# Patient Record
Sex: Male | Born: 1985 | Race: White | Hispanic: No | Marital: Married | Smoking: Former smoker
Health system: Southern US, Community
[De-identification: ages and names within clinical notes are randomized; demographics above are authoritative.]

## PROBLEM LIST (undated history)

## (undated) ENCOUNTER — Emergency Department (HOSPITAL_COMMUNITY): Admission: EM | Payer: BC Managed Care – PPO | Source: Home / Self Care

## (undated) DIAGNOSIS — J45909 Unspecified asthma, uncomplicated: Secondary | ICD-10-CM

## (undated) HISTORY — PX: FINGER AMPUTATION: SHX636

---

## 2015-06-24 ENCOUNTER — Encounter (HOSPITAL_BASED_OUTPATIENT_CLINIC_OR_DEPARTMENT_OTHER): Payer: Self-pay | Admitting: *Deleted

## 2015-06-24 ENCOUNTER — Emergency Department (HOSPITAL_BASED_OUTPATIENT_CLINIC_OR_DEPARTMENT_OTHER)

## 2015-06-24 ENCOUNTER — Emergency Department (HOSPITAL_BASED_OUTPATIENT_CLINIC_OR_DEPARTMENT_OTHER)
Admission: EM | Admit: 2015-06-24 | Discharge: 2015-06-24 | Disposition: A | Discharge status: Home / Self Care | Attending: Emergency Medicine | Admitting: Emergency Medicine

## 2015-06-24 DIAGNOSIS — Y999 Unspecified external cause status: Secondary | ICD-10-CM | POA: Diagnosis not present

## 2015-06-24 DIAGNOSIS — F17210 Nicotine dependence, cigarettes, uncomplicated: Secondary | ICD-10-CM | POA: Diagnosis not present

## 2015-06-24 DIAGNOSIS — W11XXXA Fall on and from ladder, initial encounter: Secondary | ICD-10-CM | POA: Insufficient documentation

## 2015-06-24 DIAGNOSIS — Y929 Unspecified place or not applicable: Secondary | ICD-10-CM | POA: Diagnosis not present

## 2015-06-24 DIAGNOSIS — Y939 Activity, unspecified: Secondary | ICD-10-CM | POA: Diagnosis not present

## 2015-06-24 DIAGNOSIS — S99912A Unspecified injury of left ankle, initial encounter: Secondary | ICD-10-CM | POA: Diagnosis present

## 2015-06-24 DIAGNOSIS — S93402A Sprain of unspecified ligament of left ankle, initial encounter: Secondary | ICD-10-CM

## 2015-06-24 NOTE — ED Notes (Addendum)
Ice pack and elevation initiated, pain is more at medial aspect of ankle area, very tender to touch, pt states he took ibuprofen prior to arrival to ED

## 2015-06-24 NOTE — ED Notes (Signed)
Patient transported to X-ray 

## 2015-06-24 NOTE — ED Provider Notes (Signed)
CSN: [id number]     Arrival date & time 06/24/15  1027 History   First MD Initiated Contact with Patient 06/24/15 1048     Chief Complaint  Patient presents with  . Ankle Pain   HPI   30 year old male presents with left ankle pain. Patient reports that he was on a ladder when he tripped fell landing on his left ankle. He reports the urgent of the ankle, pain to the medial aspect. Patient reports he is able walk after the incident, but is having difficulty with ambulation due to pain. Patient reports taking ibuprofen at home. He denies any loss of sensation, motor function. No history of the same, no gross abnormalities noted. Patient denies any pain to the proximal lower extremity or fibular head.  History reviewed. No pertinent past medical history. History reviewed. No pertinent past surgical history. No family history on file. Social History  Substance Use Topics  . Smoking status: Current Every Day Smoker -- 0.50 packs/day    Types: Cigarettes  . Smokeless tobacco: None  . Alcohol Use: Yes     Comment: social    Review of Systems  All other systems reviewed and are negative.   Allergies  Review of patient's allergies indicates no known allergies.  Home Medications   Prior to Admission medications   Not on File   BP 122/81 mmHg  Temp(Src) 97.3 F (36.3 C) (Oral)  Resp 18  Ht  (1.854 m)  Wt 88.451 kg  BMI 25.73 kg/m2  SpO2 100%   Physical Exam  Constitutional: He is oriented to person, place, and time. He appears well-developed and well-nourished.  HENT:  Head: Normocephalic and atraumatic.  Eyes: Conjunctivae are normal. Pupils are equal, round, and reactive to light. Right eye exhibits no discharge. Left eye exhibits no discharge. No scleral icterus.  Neck: Normal range of motion. No JVD present. No tracheal deviation present.  Pulmonary/Chest: Effort normal. No stridor.  Musculoskeletal:  No obvious swelling or deformities, tenderness to palpation of the  medial malleolus, pain with eversion of the ankle, no laxity noted, pedal pulses 2+, full passive range of motion. Active plantar flexion/ dorsiflexion with pain  Neurological: He is alert and oriented to person, place, and time. Coordination normal.  Psychiatric: He has a normal mood and affect. His behavior is normal. Judgment and thought content normal.  Nursing note and vitals reviewed.   ED Course  Procedures (including critical care time) Labs Review Labs Reviewed - No data to display  Imaging Review Dg Ankle Complete Left  06/24/2015  CLINICAL DATA:  Larey Seat from ladder, 6-8 feet. Unable to weight bear or dorsiflex. Ladder was at home. EXAM: LEFT ANKLE COMPLETE - 3+ VIEW COMPARISON:  None. FINDINGS: There is no evidence of fracture, dislocation, or joint effusion. There is no evidence of arthropathy or other focal bone abnormality. Soft tissues are unremarkable. IMPRESSION: Negative. Electronically Signed   By: Paulina Fusi M.D.   On: 06/24/2015 11:07   I have personally reviewed and evaluated these images and lab results as part of my medical decision-making.   EKG Interpretation None      MDM   Final diagnoses:  Ankle sprain, left, initial encounter    Labs:  Imaging:  Consults:  Therapeutics:  Discharge Meds:   Assessment/Plan:30 year old male presents today with likely ankle sprain. No signs of fracture, no obvious swelling or deformities. Patient will be given crutches, work note, instructed to use ibuprofen, ice. No significant laxity on exam, primary care  follow-up.        Eyvonne MechanicJeffrey Lisanne Ponce, PA-C 06/24/15 1120  Rolan BuccoMelanie Belfi, MD 06/24/15 1135

## 2015-06-24 NOTE — Discharge Instructions (Signed)
Cryotherapy °Cryotherapy means treatment with cold. Ice or gel packs can be used to reduce both pain and swelling. Ice is the most helpful within the first 24 to 48 hours after an injury or flare-up from overusing a muscle or joint. Sprains, strains, spasms, burning pain, shooting pain, and aches can all be eased with ice. Ice can also be used when recovering from surgery. Ice is effective, has very few side effects, and is safe for most people to use. °PRECAUTIONS  °Ice is not a safe treatment option for people with: °· Raynaud phenomenon. This is a condition affecting small blood vessels in the extremities. Exposure to cold may cause your problems to return. °· Cold hypersensitivity. There are many forms of cold hypersensitivity, including: °· Cold urticaria. Red, itchy hives appear on the skin when the tissues begin to warm after being iced. °· Cold erythema. This is a red, itchy rash caused by exposure to cold. °· Cold hemoglobinuria. Red blood cells break down when the tissues begin to warm after being iced. The hemoglobin that carry oxygen are passed into the urine because they cannot combine with blood proteins fast enough. °· Numbness or altered sensitivity in the area being iced. °If you have any of the following conditions, do not use ice until you have discussed cryotherapy with your caregiver: °· Heart conditions, such as arrhythmia, angina, or chronic heart disease. °· High blood pressure. °· Healing wounds or open skin in the area being iced. °· Current infections. °· Rheumatoid arthritis. °· Poor circulation. °· Diabetes. °Ice slows the blood flow in the region it is applied. This is beneficial when trying to stop inflamed tissues from spreading irritating chemicals to surrounding tissues. However, if you expose your skin to cold temperatures for too Hirschman or without the proper protection, you can damage your skin or nerves. Watch for signs of skin damage due to cold. °HOME CARE INSTRUCTIONS °Follow  these tips to use ice and cold packs safely. °· Place a dry or damp towel between the ice and skin. A damp towel will cool the skin more quickly, so you may need to shorten the time that the ice is used. °· For a more rapid response, add gentle compression to the ice. °· Ice for no more than 10 to 20 minutes at a time. The bonier the area you are icing, the less time it will take to get the benefits of ice. °· Check your skin after 5 minutes to make sure there are no signs of a poor response to cold or skin damage. °· Rest 20 minutes or more between uses. °· Once your skin is numb, you can end your treatment. You can test numbness by very lightly touching your skin. The touch should be so light that you do not see the skin dimple from the pressure of your fingertip. When using ice, most people will feel these normal sensations in this order: cold, burning, aching, and numbness. °· Do not use ice on someone who cannot communicate their responses to pain, such as small children or people with dementia. °HOW TO MAKE AN ICE PACK °Ice packs are the most common way to use ice therapy. Other methods include ice massage, ice baths, and cryosprays. Muscle creams that cause a cold, tingly feeling do not offer the same benefits that ice offers and should not be used as a substitute unless recommended by your caregiver. °To make an ice pack, do one of the following: °· Place crushed ice or a   bag of frozen vegetables in a sealable plastic bag. Squeeze out the excess air. Place this bag inside another plastic bag. Slide the bag into a pillowcase or place a damp towel between your skin and the bag.  Mix 3 parts water with 1 part rubbing alcohol. Freeze the mixture in a sealable plastic bag. When you remove the mixture from the freezer, it will be slushy. Squeeze out the excess air. Place this bag inside another plastic bag. Slide the bag into a pillowcase or place a damp towel between your skin and the bag. SEEK MEDICAL CARE  IF:  You develop white spots on your skin. This may give the skin a blotchy (mottled) appearance.  Your skin turns blue or pale.  Your skin becomes waxy or hard.  Your swelling gets worse. MAKE SURE YOU:   Understand these instructions.  Will watch your condition.  Will get help right away if you are not doing well or get worse.   This information is not intended to replace advice given to you by your health care provider. Make sure you discuss any questions you have with your health care provider.   Document Released: 09/24/2010 Document Revised: 02/18/2014 Document Reviewed: 09/24/2010 Elsevier Interactive Patient Education 2016 [employer]  Ankle Sprain An ankle sprain is an injury to the strong, fibrous tissues (ligaments) that hold the bones of your ankle joint together.  CAUSES An ankle sprain is usually caused by a fall or by twisting your ankle. Ankle sprains most commonly occur when you step on the outer edge of your foot, and your ankle turns inward. People who participate in sports are more prone to these types of injuries.  SYMPTOMS   Pain in your ankle. The pain may be present at rest or only when you are trying to stand or walk.  Swelling.  Bruising. Bruising may develop immediately or within 1 to 2 days after your injury.  Difficulty standing or walking, particularly when turning corners or changing directions. DIAGNOSIS  Your caregiver will ask you details about your injury and perform a physical exam of your ankle to determine if you have an ankle sprain. During the physical exam, your caregiver will press on and apply pressure to specific areas of your foot and ankle. Your caregiver will try to move your ankle in certain ways. An X-ray exam may be done to be sure a bone was not broken or a ligament did not separate from one of the bones in your ankle (avulsion fracture).  TREATMENT  Certain types of braces can help stabilize your ankle. Your caregiver can  make a recommendation for this. Your caregiver may recommend the use of medicine for pain. If your sprain is severe, your caregiver may refer you to a surgeon who helps to restore function to parts of your skeletal system (orthopedist) or a physical therapist. HOME CARE INSTRUCTIONS   Apply ice to your injury for 1-2 days or as directed by your caregiver. Applying ice helps to reduce inflammation and pain.  Put ice in a plastic bag.  Place a towel between your skin and the bag.  Leave the ice on for 15-20 minutes at a time, every 2 hours while you are awake.  Only take over-the-counter or prescription medicines for pain, discomfort, or fever as directed by your caregiver.  Elevate your injured ankle above the level of your heart as much as possible for 2-3 days.  If your caregiver recommends crutches, use them as instructed. Gradually put weight  on the affected ankle. Continue to use crutches or a cane until you can walk without feeling pain in your ankle.  If you have a plaster splint, wear the splint as directed by your caregiver. Do not rest it on anything harder than a pillow for the first 24 hours. Do not put weight on it. Do not get it wet. You may take it off to take a shower or bath.  You may have been given an elastic bandage to wear around your ankle to provide support. If the elastic bandage is too tight (you have numbness or tingling in your foot or your foot becomes cold and blue), adjust the bandage to make it comfortable.  If you have an air splint, you may blow more air into it or let air out to make it more comfortable. You may take your splint off at night and before taking a shower or bath. Wiggle your toes in the splint several times per day to decrease swelling. SEEK MEDICAL CARE IF:   You have rapidly increasing bruising or swelling.  Your toes feel extremely cold or you lose feeling in your foot.  Your pain is not relieved with medicine. SEEK IMMEDIATE MEDICAL CARE  IF:  Your toes are numb or blue.  You have severe pain that is increasing. MAKE SURE YOU:   Understand these instructions.  Will watch your condition.  Will get help right away if you are not doing well or get worse.   This information is not intended to replace advice given to you by your health care provider. Make sure you discuss any questions you have with your health care provider.   Document Released: 01/28/2005 Document Revised: 02/18/2014 Document Reviewed: 02/09/2011 Elsevier Interactive Patient Education 2016 [employer]  Acute Ankle Sprain With Phase I Rehab An acute ankle sprain is a partial or complete tear in one or more of the ligaments of the ankle due to traumatic injury. The severity of the injury depends on both the number of ligaments sprained and the grade of sprain. There are 3 grades of sprains.   A grade 1 sprain is a mild sprain. There is a slight pull without obvious tearing. There is no loss of strength, and the muscle and ligament are the correct length.  A grade 2 sprain is a moderate sprain. There is tearing of fibers within the substance of the ligament where it connects two bones or two cartilages. The length of the ligament is increased, and there is usually decreased strength.  A grade 3 sprain is a complete rupture of the ligament and is uncommon. In addition to the grade of sprain, there are three types of ankle sprains.  Lateral ankle sprains: This is a sprain of one or more of the three ligaments on the outer side (lateral) of the ankle. These are the most common sprains. Medial ankle sprains: There is one large triangular ligament of the inner side (medial) of the ankle that is susceptible to injury. Medial ankle sprains are less common. Syndesmosis, "high ankle," sprains: The syndesmosis is the ligament that connects the two bones of the lower leg. Syndesmosis sprains usually only occur with very severe ankle sprains. SYMPTOMS  Pain,  tenderness, and swelling in the ankle, starting at the side of injury that may progress to the whole ankle and foot with time.  "Pop" or tearing sensation at the time of injury.  Bruising that may spread to the heel.  Impaired ability to walk soon after injury.  CAUSES   Acute ankle sprains are caused by trauma placed on the ankle that temporarily forces or pries the anklebone (talus) out of its normal socket.  Stretching or tearing of the ligaments that normally hold the joint in place (usually due to a twisting injury). RISK INCREASES WITH:  Previous ankle sprain.  Sports in which the foot may land awkwardly (i.e., basketball, volleyball, or soccer) or walking or running on uneven or rough surfaces.  Shoes with inadequate support to prevent sideways motion when stress occurs.  Poor strength and flexibility.  Poor balance skills.  Contact sports. PREVENTION   Warm up and stretch properly before activity.  Maintain physical fitness:  Ankle and leg flexibility, muscle strength, and endurance.  Cardiovascular fitness.  Balance training activities.  Use proper technique and have a coach correct improper technique.  Taping, protective strapping, bracing, or high-top tennis shoes may help prevent injury. Initially, tape is best; however, it loses most of its support function within 10 to 15 minutes.  Wear proper-fitted protective shoes (High-top shoes with taping or bracing is more effective than either alone).  Provide the ankle with support during sports and practice activities for 12 months following injury. PROGNOSIS   If treated properly, ankle sprains can be expected to recover completely; however, the length of recovery depends on the degree of injury.  A grade 1 sprain usually heals enough in 5 to 7 days to allow modified activity and requires an average of 6 weeks to heal completely.  A grade 2 sprain requires 6 to 10 weeks to heal completely.  A grade 3 sprain  requires 12 to 16 weeks to heal.  A syndesmosis sprain often takes more than 3 months to heal. RELATED COMPLICATIONS   Frequent recurrence of symptoms may result in a chronic problem. Appropriately addressing the problem the first time decreases the frequency of recurrence and optimizes healing time. Severity of the initial sprain does not predict the likelihood of later instability.  Injury to other structures (bone, cartilage, or tendon).  A chronically unstable or arthritic ankle joint is a possibility with repeated sprains. TREATMENT Treatment initially involves the use of ice, medication, and compression bandages to help reduce pain and inflammation. Ankle sprains are usually immobilized in a walking cast or boot to allow for healing. Crutches may be recommended to reduce pressure on the injury. After immobilization, strengthening and stretching exercises may be necessary to regain strength and a full range of motion. Surgery is rarely needed to treat ankle sprains. MEDICATION   Nonsteroidal anti-inflammatory medications, such as aspirin and ibuprofen (do not take for the first 3 days after injury or within 7 days before surgery), or other minor pain relievers, such as acetaminophen, are often recommended. Take these as directed by your caregiver. Contact your caregiver immediately if any bleeding, stomach upset, or signs of an allergic reaction occur from these medications.  Ointments applied to the skin may be helpful.  Pain relievers may be prescribed as necessary by your caregiver. Do not take prescription pain medication for longer than 4 to 7 days. Use only as directed and only as much as you need. HEAT AND COLD  Cold treatment (icing) is used to relieve pain and reduce inflammation for acute and chronic cases. Cold should be applied for 10 to 15 minutes every 2 to 3 hours for inflammation and pain and immediately after any activity that aggravates your symptoms. Use ice packs or an  ice massage.  Heat treatment may be used before  performing stretching and strengthening activities prescribed by your caregiver. Use a heat pack or a warm soak. SEEK IMMEDIATE MEDICAL CARE IF:   Pain, swelling, or bruising worsens despite treatment.  You experience pain, numbness, discoloration, or coldness in the foot or toes.  New, unexplained symptoms develop (drugs used in treatment may produce side effects.) EXERCISES  PHASE I EXERCISES RANGE OF MOTION (ROM) AND STRETCHING EXERCISES - Ankle Sprain, Acute Phase I, Weeks 1 to 2 These exercises may help you when beginning to restore flexibility in your ankle. You will likely work on these exercises for the 1 to 2 weeks after your injury. Once your physician, physical therapist, or athletic trainer sees adequate progress, he or she will advance your exercises. While completing these exercises, remember:   Restoring tissue flexibility helps normal motion to return to the joints. This allows healthier, less painful movement and activity.  An effective stretch should be held for at least 30 seconds.  A stretch should never be painful. You should only feel a gentle lengthening or release in the stretched tissue. RANGE OF MOTION - Dorsi/Plantar Flexion  While sitting with your right / left knee straight, draw the top of your foot upwards by flexing your ankle. Then reverse the motion, pointing your toes downward.  Hold each position for __________ seconds.  After completing your first set of exercises, repeat this exercise with your knee bent. Repeat __________ times. Complete this exercise __________ times per day.  RANGE OF MOTION - Ankle Alphabet  Imagine your right / left big toe is a pen.  Keeping your hip and knee still, write out the entire alphabet with your "pen." Make the letters as large as you can without increasing any discomfort. Repeat __________ times. Complete this exercise __________ times per day.  STRENGTHENING  EXERCISES - Ankle Sprain, Acute -Phase I, Weeks 1 to 2 These exercises may help you when beginning to restore strength in your ankle. You will likely work on these exercises for 1 to 2 weeks after your injury. Once your physician, physical therapist, or athletic trainer sees adequate progress, he or she will advance your exercises. While completing these exercises, remember:   Muscles can gain both the endurance and the strength needed for everyday activities through controlled exercises.  Complete these exercises as instructed by your physician, physical therapist, or athletic trainer. Progress the resistance and repetitions only as guided.  You may experience muscle soreness or fatigue, but the pain or discomfort you are trying to eliminate should never worsen during these exercises. If this pain does worsen, stop and make certain you are following the directions exactly. If the pain is still present after adjustments, discontinue the exercise until you can discuss the trouble with your clinician. STRENGTH - Dorsiflexors  Secure a rubber exercise band/tubing to a fixed object (i.e., table, pole) and loop the other end around your right / left foot.  Sit on the floor facing the fixed object. The band/tubing should be slightly tense when your foot is relaxed.  Slowly draw your foot back toward you using your ankle and toes.  Hold this position for __________ seconds. Slowly release the tension in the band and return your foot to the starting position. Repeat __________ times. Complete this exercise __________ times per day.  STRENGTH - Plantar-flexors   Sit with your right / left leg extended. Holding onto both ends of a rubber exercise band/tubing, loop it around the ball of your foot. Keep a slight tension in the band.  Slowly push your toes away from you, pointing them downward.  Hold this position for __________ seconds. Return slowly, controlling the tension in the band/tubing. Repeat  __________ times. Complete this exercise __________ times per day.  STRENGTH - Ankle Eversion  Secure one end of a rubber exercise band/tubing to a fixed object (table, pole). Loop the other end around your foot just before your toes.  Place your fists between your knees. This will focus your strengthening at your ankle.  Drawing the band/tubing across your opposite foot, slowly, pull your little toe out and up. Make sure the band/tubing is positioned to resist the entire motion.  Hold this position for __________ seconds. Have your muscles resist the band/tubing as it slowly pulls your foot back to the starting position.  Repeat __________ times. Complete this exercise __________ times per day.  STRENGTH - Ankle Inversion  Secure one end of a rubber exercise band/tubing to a fixed object (table, pole). Loop the other end around your foot just before your toes.  Place your fists between your knees. This will focus your strengthening at your ankle.  Slowly, pull your big toe up and in, making sure the band/tubing is positioned to resist the entire motion.  Hold this position for __________ seconds.  Have your muscles resist the band/tubing as it slowly pulls your foot back to the starting position. Repeat __________ times. Complete this exercises __________ times per day.  STRENGTH - Towel Curls  Sit in a chair positioned on a non-carpeted surface.  Place your right / left foot on a towel, keeping your heel on the floor.  Pull the towel toward your heel by only curling your toes. Keep your heel on the floor.  If instructed by your physician, physical therapist, or athletic trainer, add weight to the end of the towel. Repeat __________ times. Complete this exercise __________ times per day.   This information is not intended to replace advice given to you by your health care provider. Make sure you discuss any questions you have with your health care provider.   Document Released:  08/29/2004 Document Revised: 02/18/2014 Document Reviewed: 05/12/2008 Elsevier Interactive Patient Education [employer]

## 2015-06-24 NOTE — ED Notes (Signed)
DC instructions provided to pt, pt teaching done re: resting, ice application, comfort measures and elevation to use for pain relief, also discussed and pt teaching done with crutch use. Also informed patient when need may arise to seek further care or return to ED.

## 2015-06-24 NOTE — ED Notes (Signed)
Fell approx 6-8 feet off later, fell backwards, rotated ankle, pain at medial aspect of ankle, very tender to touch

## 2019-09-12 DIAGNOSIS — M940 Chondrocostal junction syndrome [Tietze]: Secondary | ICD-10-CM | POA: Diagnosis not present

## 2019-11-03 DIAGNOSIS — Z20828 Contact with and (suspected) exposure to other viral communicable diseases: Secondary | ICD-10-CM | POA: Diagnosis not present

## 2019-11-14 DIAGNOSIS — Z20828 Contact with and (suspected) exposure to other viral communicable diseases: Secondary | ICD-10-CM | POA: Diagnosis not present

## 2019-11-14 DIAGNOSIS — R051 Acute cough: Secondary | ICD-10-CM | POA: Diagnosis not present

## 2019-11-17 DIAGNOSIS — B342 Coronavirus infection, unspecified: Secondary | ICD-10-CM | POA: Diagnosis not present

## 2019-11-23 DIAGNOSIS — J0100 Acute maxillary sinusitis, unspecified: Secondary | ICD-10-CM | POA: Diagnosis not present

## 2019-11-23 DIAGNOSIS — R438 Other disturbances of smell and taste: Secondary | ICD-10-CM | POA: Diagnosis not present

## 2019-11-23 DIAGNOSIS — Z20828 Contact with and (suspected) exposure to other viral communicable diseases: Secondary | ICD-10-CM | POA: Diagnosis not present

## 2019-12-02 DIAGNOSIS — B342 Coronavirus infection, unspecified: Secondary | ICD-10-CM | POA: Diagnosis not present

## 2020-01-31 DIAGNOSIS — R945 Abnormal results of liver function studies: Secondary | ICD-10-CM | POA: Diagnosis not present

## 2020-01-31 DIAGNOSIS — Z0000 Encounter for general adult medical examination without abnormal findings: Secondary | ICD-10-CM | POA: Diagnosis not present

## 2020-01-31 DIAGNOSIS — Z136 Encounter for screening for cardiovascular disorders: Secondary | ICD-10-CM | POA: Diagnosis not present

## 2020-01-31 DIAGNOSIS — R7301 Impaired fasting glucose: Secondary | ICD-10-CM | POA: Diagnosis not present

## 2020-01-31 DIAGNOSIS — R799 Abnormal finding of blood chemistry, unspecified: Secondary | ICD-10-CM | POA: Diagnosis not present

## 2020-03-16 ENCOUNTER — Other Ambulatory Visit: Payer: Self-pay

## 2020-03-16 ENCOUNTER — Ambulatory Visit
Admission: EM | Admit: 2020-03-16 | Discharge: 2020-03-16 | Disposition: A | Discharge status: Home / Self Care | Payer: BC Managed Care – PPO

## 2020-03-16 DIAGNOSIS — M5442 Lumbago with sciatica, left side: Secondary | ICD-10-CM

## 2020-03-16 MED ORDER — KETOROLAC TROMETHAMINE 30 MG/ML IJ SOLN
30.0000 mg | Freq: Once | INTRAMUSCULAR | Status: AC
  Administered 2020-03-16: 30 mg via INTRAMUSCULAR

## 2020-03-16 MED ORDER — CYCLOBENZAPRINE HCL 10 MG PO TABS: 10.0000 mg | ORAL_TABLET | Freq: Two times a day (BID) | ORAL | 0 refills | Status: AC | PRN

## 2020-03-16 MED ORDER — KETOROLAC TROMETHAMINE 60 MG/2ML IM SOLN: 60.0000 mg | Freq: Once | INTRAMUSCULAR | Status: DC

## 2020-03-16 MED ORDER — PREDNISONE 20 MG PO TABS: ORAL_TABLET | ORAL | 0 refills | Status: DC

## 2020-03-16 MED ORDER — DEXAMETHASONE SODIUM PHOSPHATE 10 MG/ML IJ SOLN
10.0000 mg | Freq: Once | INTRAMUSCULAR | Status: AC
  Administered 2020-03-16: 10 mg via INTRAMUSCULAR

## 2020-03-16 NOTE — ED Triage Notes (Signed)
Patient presents to Urgent Care with complaints of back pain since 01/26.  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 He reports pain with sitting a 1/10 and increases to 6/10 with ambulation. Pt applied heating pad with some relief and Tylenol.

## 2020-03-16 NOTE — ED Provider Notes (Signed)
[facility]    CSN: [id number] Arrival date & time: 03/16/20  1233      History   Chief Complaint Chief Complaint  Patient presents with  . Back Pain    HPI Caleb Thompson is a 35 y.o. male.   HPI Patient presents today with back pain which has been ongoing since 03/08/2020.  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  He is unaware of any injury.  He has been taking Tylenol and applying heat to his back without any relief of symptoms.  He endorses resting over the weekend which seemed to improve pain however he returned to work today and after a 25-minute drive he was unable to bear weight without experiencing severe shooting pain which increased on the left side.  He is unaware of any injury.  No history of recurrent back pain  History reviewed. No pertinent past medical history.  There are no problems to display for this patient.   History reviewed. No pertinent surgical history.     Home Medications    Prior to Admission medications   Medication Sig Start Date End Date Taking? Authorizing Provider  Multiple Vitamin (THERA) TABS Take 1 tablet by mouth daily.    [provider]    Family History Family History  Problem Relation Age of Onset  . Healthy Mother   . Healthy Father     Social History Social History   Tobacco Use  . Smoking status: Current Every Day Smoker    Packs/day: 0.50    Types: Cigarettes  . Smokeless tobacco: Never Used  Substance Use Topics  . Alcohol use: Yes    Comment: social  . Drug use: No     Allergies   Patient has no known allergies.  Review of Systems Review of Systems Pertinent negatives listed in HPI   Physical Exam Triage Vital Signs ED Triage Vitals  Enc Vitals Group     BP 03/16/20 1405 (!) 142/101     Pulse Rate 03/16/20 1405 74     Resp 03/16/20 1405 20     Temp 03/16/20 1405 97.9 F (36.6 C)     Temp Source 03/16/20 1405  Oral     SpO2 03/16/20 1405 97 %     Weight 03/16/20 1413 190 lb (86.2 kg)     Height --      Head Circumference --      Peak Flow --      Pain Score 03/16/20 1413 1     Pain Loc --      Pain Edu? --      Excl. in GC? --    No data found.  Updated Vital Signs BP (!) 142/101 (BP Location: Left Arm)   Pulse 74   Temp 97.9 F (36.6 C) (Oral)   Resp 20   Wt 190 lb (86.2 kg)   SpO2 97%   BMI 25.07 kg/m   Visual Acuity Right Eye Distance:   Left Eye Distance:   Bilateral Distance:    Right Eye Near:   Left Eye Near:    Bilateral Near:     Physical Exam  General appearance: alert, well developed, well nourished, cooperative and in no distress Head: Normocephalic, without obvious abnormality, atraumatic Respiratory: Respirations even and unlabored, normal respiratory rate Heart: rate and rhythm normal. No gallop or murmurs noted on exam  Thoracic/lumbar spine exam: Patient appears to be in mild to moderate  pain, antalgic gait noted. Lumbosacral spine area reveals no local tenderness or mass. Painful and reduced LS ROM noted. Straight leg raise is positive at 30 degrees on left. Motor strength and sensation normal Skin: Skin color, texture, turgor normal. No rashes seen  Psych: Appropriate mood and affect. Neurologic: Mental status: Alert, oriented to person, place, and time, thought content appropriate.  UC Treatments / Results  Labs (all labs ordered are listed, but only abnormal results are displayed) Labs Reviewed - No data to display  EKG   Radiology No results found.  Procedures Procedures (including critical care time)  Medications Ordered in UC Medications - No data to display  Initial Impression / Assessment and Plan / UC Course  I have reviewed the triage vital signs and the nursing notes.  Pertinent labs & imaging results that were available during my care of the patient were reviewed by me and considered in my medical decision making (see chart for  details).     Patient presents today with acute low back pain with left-sided sciatica without known injury. Toradol 60 mg and Decadron 10 mg IM given in clinic today. Oral prednisone taper start tomorrow.  Cyclobenzaprine for back pain as needed as prescribed.  PCP follow-up if symptoms worsen or do not improve. Final Clinical Impressions(s) / UC Diagnoses   Final diagnoses:  Acute midline low back pain with left-sided sciatica     Discharge Instructions     Start oral prednisone taper tomorrow to 05/31/2020 as you received a steroid injection while here in clinic today.    You also received Toradol for pain this will last for up to 8 hours therefore do not take any ibuprofen or naproxen within the next 8 hours.  For pain I have prescribed you cyclobenzaprine 10 mg you can take this up to 3 times daily for back pain.  Avoid driving or operating any machinery while taking medication as this can cause severe drowsiness.    ED Prescriptions    Medication Sig Dispense Auth. Provider   predniSONE (DELTASONE) 20 MG tablet Take 3 PO QAM x3days, 2 PO QAM x3days, 1 PO QAM x3days 18 tablet Bing Neighbors, FNP   cyclobenzaprine (FLEXERIL) 10 MG tablet Take 1 tablet (10 mg total) by mouth 2 (two) times daily as needed for muscle spasms. 20 tablet Bing Neighbors, FNP     PDMP not reviewed this encounter.   Bing Neighbors, FNP 03/16/20 [phone]

## 2020-03-16 NOTE — Discharge Instructions (Signed)
Start oral prednisone taper tomorrow to 05/31/2020 as you received a steroid injection while here in clinic today.    You also received Toradol for pain this will last for up to 8 hours therefore do not take any ibuprofen or naproxen within the next 8 hours.  For pain I have prescribed you cyclobenzaprine 10 mg you can take this up to 3 times daily for back pain.  Avoid driving or operating any machinery while taking medication as this can cause severe drowsiness.

## 2020-03-29 DIAGNOSIS — E781 Pure hyperglyceridemia: Secondary | ICD-10-CM | POA: Diagnosis not present

## 2020-04-22 DIAGNOSIS — M545 Low back pain, unspecified: Secondary | ICD-10-CM | POA: Diagnosis not present

## 2020-05-04 DIAGNOSIS — M545 Low back pain, unspecified: Secondary | ICD-10-CM | POA: Diagnosis not present

## 2020-05-11 DIAGNOSIS — M545 Low back pain, unspecified: Secondary | ICD-10-CM | POA: Diagnosis not present

## 2020-05-16 DIAGNOSIS — M545 Low back pain, unspecified: Secondary | ICD-10-CM | POA: Diagnosis not present

## 2020-05-18 DIAGNOSIS — M545 Low back pain, unspecified: Secondary | ICD-10-CM | POA: Diagnosis not present

## 2020-06-01 DIAGNOSIS — M545 Low back pain, unspecified: Secondary | ICD-10-CM | POA: Diagnosis not present

## 2020-06-13 DIAGNOSIS — M5136 Other intervertebral disc degeneration, lumbar region: Secondary | ICD-10-CM | POA: Diagnosis not present

## 2020-06-29 DIAGNOSIS — M5126 Other intervertebral disc displacement, lumbar region: Secondary | ICD-10-CM | POA: Diagnosis not present

## 2020-07-04 DIAGNOSIS — M545 Low back pain, unspecified: Secondary | ICD-10-CM | POA: Diagnosis not present

## 2020-07-18 DIAGNOSIS — M545 Low back pain, unspecified: Secondary | ICD-10-CM | POA: Diagnosis not present

## 2020-10-26 ENCOUNTER — Ambulatory Visit
Admission: EM | Admit: 2020-10-26 | Discharge: 2020-10-26 | Disposition: A | Discharge status: Home / Self Care | Payer: BC Managed Care – PPO | Attending: Internal Medicine | Admitting: Internal Medicine

## 2020-10-26 ENCOUNTER — Other Ambulatory Visit: Payer: Self-pay

## 2020-10-26 DIAGNOSIS — R21 Rash and other nonspecific skin eruption: Secondary | ICD-10-CM

## 2020-10-26 DIAGNOSIS — L509 Urticaria, unspecified: Secondary | ICD-10-CM | POA: Diagnosis not present

## 2020-10-26 DIAGNOSIS — T7840XA Allergy, unspecified, initial encounter: Secondary | ICD-10-CM | POA: Diagnosis not present

## 2020-10-26 HISTORY — DX: Unspecified asthma, uncomplicated: J45.909

## 2020-10-26 MED ORDER — METHYLPREDNISOLONE ACETATE 80 MG/ML IJ SUSP
80.0000 mg | Freq: Once | INTRAMUSCULAR | Status: AC
  Administered 2020-10-26: 80 mg via INTRAMUSCULAR

## 2020-10-26 MED ORDER — HYDROXYZINE HCL 25 MG PO TABS: 25.0000 mg | ORAL_TABLET | Freq: Four times a day (QID) | ORAL | 0 refills | Status: AC | PRN

## 2020-10-26 NOTE — ED Triage Notes (Signed)
Pt c/o generalized hives and itchiness with sudden onset. States it felt like his throat was closing. He used his albuterol inhaler and had taken 2 benadryl pta.   States currently his breathing is better however observable deep breathing. Spo2 97%. Productive cough.

## 2020-10-26 NOTE — ED Provider Notes (Signed)
[facility]    CSN: [id number] Arrival date & time: 10/26/20  1302      History   Chief Complaint Chief Complaint  Patient presents with   Shortness of Breath    HPI Caleb Thompson is a 35 y.o. male.   Patient presents with itchy rash with associated symptoms of itchy throat and feelings of "throat swelling".  Symptoms started about an hour prior.  Patient is not sure what he is having a reaction to as he has not eaten or drink anything recently.  Patient does have history of asthma and has used his albuterol inhaler and taken two Benadryl pills that he is not sure the dose of prior to arrival.  Patient states that rash and throat sensation has started to improve and dissipate since taking Benadryl.  Denies any active shortness of breath or tongue or mouth swelling.  Denies any chest pain.   Shortness of Breath  Past Medical History:  Diagnosis Date   Asthma     There are no problems to display for this patient.   History reviewed. No pertinent surgical history.     Home Medications    Prior to Admission medications   Medication Sig Start Date End Date Taking? Authorizing Provider  hydrOXYzine (ATARAX/VISTARIL) 25 MG tablet Take 1 tablet (25 mg total) by mouth every 6 (six) hours as needed for itching. 10/26/20  Yes Lance Muss, FNP  cyclobenzaprine (FLEXERIL) 10 MG tablet Take 1 tablet (10 mg total) by mouth 2 (two) times daily as needed for muscle spasms. 03/16/20   Bing Neighbors, FNP  Multiple Vitamin (THERA) TABS Take 1 tablet by mouth daily.    [provider]  predniSONE (DELTASONE) 20 MG tablet Take 3 PO QAM x3days, 2 PO QAM x3days, 1 PO QAM x3days 03/16/20   Bing Neighbors, FNP    Family History Family History  Problem Relation Age of Onset   Healthy Mother    Healthy Father     Social History Social History   Tobacco Use   Smoking status: Every Day    Packs/day: 0.50    Types: Cigarettes   Smokeless tobacco: Never   Substance Use Topics   Alcohol use: Yes    Comment: social   Drug use: No     Allergies   Patient has no known allergies.   Review of Systems Review of Systems Per HPI  Physical Exam Triage Vital Signs ED Triage Vitals  Enc Vitals Group     BP 10/26/20 1311 (!) 130/93     Pulse Rate 10/26/20 1311 (!) 103     Resp 10/26/20 1311 18     Temp 10/26/20 1311 97.9 F (36.6 C)     Temp Source 10/26/20 1311 Oral     SpO2 10/26/20 1311 97 %     Weight --      Height --      Head Circumference --      Peak Flow --      Pain Score 10/26/20 1312 0     Pain Loc --      Pain Edu? --      Excl. in GC? --    No data found.  Updated Vital Signs BP 111/74 (BP Location: Left Arm)   Pulse 98   Temp 98 F (36.7 C) (Oral)   Resp 18   SpO2 97%   Visual Acuity Right Eye Distance:   Left Eye Distance:   Bilateral Distance:  Right Eye Near:   Left Eye Near:    Bilateral Near:     Physical Exam Constitutional:      General: He is not in acute distress.    Appearance: Normal appearance. He is not ill-appearing, toxic-appearing or diaphoretic.  HENT:     Head: Normocephalic and atraumatic.     Right Ear: Tympanic membrane and ear canal normal.     Left Ear: Tympanic membrane and ear canal normal.     Nose: Nose normal.     Mouth/Throat:     Mouth: Mucous membranes are moist.     Pharynx: Oropharynx is clear. No oropharyngeal exudate or posterior oropharyngeal erythema.  Eyes:     Extraocular Movements: Extraocular movements intact.     Conjunctiva/sclera: Conjunctivae normal.  Cardiovascular:     Rate and Rhythm: Normal rate and regular rhythm.     Pulses: Normal pulses.     Heart sounds: Normal heart sounds.  Pulmonary:     Effort: Pulmonary effort is normal. No respiratory distress.     Breath sounds: Normal breath sounds. No wheezing or rhonchi.  Musculoskeletal:        General: Normal range of motion.  Skin:    General: Skin is warm and dry.     Findings:  Rash present. Rash is urticarial.     Comments: Hives present to bilateral upper extremities, chest chest, abdomen, back.  Neurological:     General: No focal deficit present.     Mental Status: He is alert and oriented to person, place, and time. Mental status is at baseline.  Psychiatric:        Mood and Affect: Mood normal.        Behavior: Behavior normal.        Thought Content: Thought content normal.        Judgment: Judgment normal.     UC Treatments / Results  Labs (all labs ordered are listed, but only abnormal results are displayed) Labs Reviewed - No data to display  EKG   Radiology No results found.  Procedures Procedures (including critical care time)  Medications Ordered in UC Medications  methylPREDNISolone acetate (DEPO-MEDROL) injection 80 mg (80 mg Intramuscular Given 10/26/20 1328)    Initial Impression / Assessment and Plan / UC Course  I have reviewed the triage vital signs and the nursing notes.  Pertinent labs & imaging results that were available during my care of the patient were reviewed by me and considered in my medical decision making (see chart for details).     Patient does not appear to be having anaphylactic reaction.  Do not think epinephrine administration is necessary at this time.  Patient declined epi administration with shared decision making.  80 mg methylprednisone administered to decrease allergic reaction in urgent care today.  Patient was kept and monitored after methylprednisone injection.  Vital signs and patient symptoms have significantly improved since injection.  Patient stated that he feels "almost back to normal".  Denies any shortness of breath prior to discharge.  Vital signs were also stable at discharge.  Hydroxyzine also prescribed on discharge.  Patient was very stable discharge, did not have any shortness of breath, or any signs of anaphylactic reaction so feel comfortable with patient being discharged home.  Advised  patient to go the hospital if shortness of breath occurs or feelings of throat swelling return.Discussed strict return precautions. Patient verbalized understanding and is agreeable with plan.  Final Clinical Impressions(s) / UC Diagnoses  Final diagnoses:  Allergic reaction, initial encounter  Rash and nonspecific skin eruption     Discharge Instructions      Please go to the hospital if allergic reaction worsens or if shortness of breath occurs.      ED Prescriptions     Medication Sig Dispense Auth. Provider   hydrOXYzine (ATARAX/VISTARIL) 25 MG tablet Take 1 tablet (25 mg total) by mouth every 6 (six) hours as needed for itching. 12 tablet Lance Muss, FNP      PDMP not reviewed this encounter.   Lance Muss, FNP 10/26/20 1446

## 2020-10-26 NOTE — Discharge Instructions (Addendum)
Please go to the hospital if allergic reaction worsens or if shortness of breath occurs.

## 2020-11-16 DIAGNOSIS — R1314 Dysphagia, pharyngoesophageal phase: Secondary | ICD-10-CM | POA: Diagnosis not present

## 2020-11-16 DIAGNOSIS — H109 Unspecified conjunctivitis: Secondary | ICD-10-CM | POA: Diagnosis not present

## 2020-11-16 DIAGNOSIS — R1013 Epigastric pain: Secondary | ICD-10-CM | POA: Diagnosis not present

## 2021-01-02 DIAGNOSIS — M545 Low back pain, unspecified: Secondary | ICD-10-CM | POA: Diagnosis not present

## 2021-01-10 DIAGNOSIS — K529 Noninfective gastroenteritis and colitis, unspecified: Secondary | ICD-10-CM | POA: Diagnosis not present

## 2021-01-11 ENCOUNTER — Emergency Department (HOSPITAL_BASED_OUTPATIENT_CLINIC_OR_DEPARTMENT_OTHER): Payer: BC Managed Care – PPO

## 2021-01-11 ENCOUNTER — Emergency Department (HOSPITAL_BASED_OUTPATIENT_CLINIC_OR_DEPARTMENT_OTHER)
Admission: EM | Admit: 2021-01-11 | Discharge: 2021-01-11 | Disposition: A | Discharge status: Home / Self Care | Payer: BC Managed Care – PPO | Attending: Emergency Medicine | Admitting: Emergency Medicine

## 2021-01-11 ENCOUNTER — Other Ambulatory Visit: Payer: Self-pay

## 2021-01-11 ENCOUNTER — Encounter (HOSPITAL_BASED_OUTPATIENT_CLINIC_OR_DEPARTMENT_OTHER): Payer: Self-pay | Admitting: *Deleted

## 2021-01-11 DIAGNOSIS — Z87891 Personal history of nicotine dependence: Secondary | ICD-10-CM | POA: Insufficient documentation

## 2021-01-11 DIAGNOSIS — R350 Frequency of micturition: Secondary | ICD-10-CM | POA: Diagnosis not present

## 2021-01-11 DIAGNOSIS — R000 Tachycardia, unspecified: Secondary | ICD-10-CM | POA: Diagnosis not present

## 2021-01-11 DIAGNOSIS — B349 Viral infection, unspecified: Secondary | ICD-10-CM | POA: Diagnosis not present

## 2021-01-11 DIAGNOSIS — R3589 Other polyuria: Secondary | ICD-10-CM | POA: Diagnosis not present

## 2021-01-11 DIAGNOSIS — J45909 Unspecified asthma, uncomplicated: Secondary | ICD-10-CM | POA: Insufficient documentation

## 2021-01-11 DIAGNOSIS — R42 Dizziness and giddiness: Secondary | ICD-10-CM | POA: Diagnosis not present

## 2021-01-11 DIAGNOSIS — R1314 Dysphagia, pharyngoesophageal phase: Secondary | ICD-10-CM | POA: Diagnosis not present

## 2021-01-11 DIAGNOSIS — R112 Nausea with vomiting, unspecified: Secondary | ICD-10-CM | POA: Diagnosis not present

## 2021-01-11 DIAGNOSIS — R110 Nausea: Secondary | ICD-10-CM | POA: Diagnosis not present

## 2021-01-11 DIAGNOSIS — R509 Fever, unspecified: Secondary | ICD-10-CM | POA: Diagnosis not present

## 2021-01-11 DIAGNOSIS — R351 Nocturia: Secondary | ICD-10-CM | POA: Insufficient documentation

## 2021-01-11 DIAGNOSIS — R0602 Shortness of breath: Secondary | ICD-10-CM | POA: Diagnosis not present

## 2021-01-11 DIAGNOSIS — R1013 Epigastric pain: Secondary | ICD-10-CM | POA: Diagnosis not present

## 2021-01-11 DIAGNOSIS — R252 Cramp and spasm: Secondary | ICD-10-CM | POA: Diagnosis not present

## 2021-01-11 DIAGNOSIS — R232 Flushing: Secondary | ICD-10-CM | POA: Diagnosis not present

## 2021-01-11 DIAGNOSIS — R109 Unspecified abdominal pain: Secondary | ICD-10-CM | POA: Diagnosis not present

## 2021-01-11 LAB — COMPREHENSIVE METABOLIC PANEL
ALT: 54 U/L — ABNORMAL HIGH (ref 0–44)
AST: 29 U/L (ref 15–41)
Albumin: 4.5 g/dL (ref 3.5–5.0)
Alkaline Phosphatase: 82 U/L (ref 38–126)
Anion gap: 10 (ref 5–15)
BUN: 17 mg/dL (ref 6–20)
CO2: 27 mmol/L (ref 22–32)
Calcium: 9.5 mg/dL (ref 8.9–10.3)
Chloride: 97 mmol/L — ABNORMAL LOW (ref 98–111)
Creatinine, Ser: 1.18 mg/dL (ref 0.61–1.24)
GFR, Estimated: >60 mL/min (ref >60)
Glucose, Bld: 104 mg/dL — ABNORMAL HIGH (ref 70–99)
Potassium: 4.4 mmol/L (ref 3.5–5.1)
Sodium: 134 mmol/L — ABNORMAL LOW (ref 135–145)
Total Bilirubin: 1.2 mg/dL (ref 0.3–1.2)
Total Protein: 7.9 g/dL (ref 6.5–8.1)

## 2021-01-11 LAB — URINALYSIS, ROUTINE W REFLEX MICROSCOPIC
Glucose, UA: NEGATIVE mg/dL
Hgb urine dipstick: NEGATIVE
Ketones, ur: NEGATIVE mg/dL
Leukocytes,Ua: NEGATIVE
Nitrite: NEGATIVE
Protein, ur: NEGATIVE mg/dL
Specific Gravity, Urine: 1.03 (ref 1.005–1.030)
pH: 5.5 (ref 5.0–8.0)

## 2021-01-11 LAB — CBC
HCT: 51 % (ref 39.0–52.0)
Hemoglobin: 17.9 g/dL — ABNORMAL HIGH (ref 13.0–17.0)
MCH: 31.2 pg (ref 26.0–34.0)
MCHC: 35.1 g/dL (ref 30.0–36.0)
MCV: 89 fL (ref 80.0–100.0)
Platelets: 168 10*3/uL (ref 150–400)
RBC: 5.73 MIL/uL (ref 4.22–5.81)
RDW: 12.5 % (ref 11.5–15.5)
WBC: 9.4 10*3/uL (ref 4.0–10.5)
nRBC: 0 % (ref 0.0–0.2)

## 2021-01-11 LAB — LIPASE, BLOOD: Lipase: 36 U/L (ref 11–51)

## 2021-01-11 LAB — MONONUCLEOSIS SCREEN: Mono Screen: NEGATIVE

## 2021-01-11 LAB — CK: Total CK: 50 U/L (ref 49–397)

## 2021-01-11 MED ORDER — IOHEXOL 300 MG/ML  SOLN
100.0000 mL | Freq: Once | INTRAMUSCULAR | Status: AC | PRN
  Administered 2021-01-11: 100 mL via INTRAVENOUS

## 2021-01-11 MED ORDER — SODIUM CHLORIDE 0.9 % IV BOLUS
1000.0000 mL | Freq: Once | INTRAVENOUS | Status: AC
  Administered 2021-01-11: 1000 mL via INTRAVENOUS

## 2021-01-11 MED ORDER — SODIUM CHLORIDE 0.9 % IV BOLUS
500.0000 mL | Freq: Once | INTRAVENOUS | Status: AC
  Administered 2021-01-11: 500 mL via INTRAVENOUS

## 2021-01-11 NOTE — Discharge Instructions (Addendum)
Your work-up in the emergency department has been reassuring.  We recommend follow-up with your primary care doctor in 1 week if symptoms remain persistent.  Return to the ED for new or concerning symptoms.

## 2021-01-11 NOTE — ED Notes (Signed)
Patient discharged to home.  All discharge instructions reviewed.  Patient verbalized understanding via teachback method.  VS WDL.  Respirations even and unlabored.  Ambulatory out of ED.   °

## 2021-01-11 NOTE — ED Provider Notes (Signed)
[facility] Provider Note   CSN: [id number] Arrival date & time: 01/11/21  1345     History Chief Complaint  Patient presents with   Abdominal Pain    Caleb Thompson is a 35 y.o. male.  35 year old male with a history of asthma and peptic ulcer disease presents to the emergency department for a multitude of vague complaints.  He reports that he has been feeling unwell for the past 2 days.  States generalized fatigue and malaise have been constant and unchanged.  He has had various other sporadic symptoms including an episode of epigastric pain in his primary care office this morning.  This was fairly severe and resolved spontaneously, but was associated with one episode of nausea and vomiting.  Notes subjective fever as well as sporadic lightheadedness with an episode of near syncope both yesterday and today.  He feels as though he is dehydrated secondary to polyuria, nocturia.  Has noticed that his urine is darker in color than normal.  He reports a history of illicit drug use as a teenager only; no IVDU.  Is a social, infrequent drinker.  Had a negative COVID and flu test at [facility] yesterday.  EKG and CBG completed at PCP office today; however, patient sent to the ED for evaluation 2/2 symptomatic complexity.  The history is provided by the patient. No language interpreter was used.  Abdominal Pain     Past Medical History:  Diagnosis Date   Asthma     There are no problems to display for this patient.   Past Surgical History:  Procedure Laterality Date   FINGER AMPUTATION         Family History  Problem Relation Age of Onset   Healthy Mother    Healthy Father     Social History   Tobacco Use   Smoking status: Former    Packs/day: 0.50    Types: Cigarettes   Smokeless tobacco: Never  Substance Use Topics   Alcohol use: Yes    Comment: social   Drug use: No    Home Medications Prior to Admission medications   Medication Sig Start  Date End Date Taking? Authorizing Provider  cyclobenzaprine (FLEXERIL) 10 MG tablet Take 1 tablet (10 mg total) by mouth 2 (two) times daily as needed for muscle spasms. 03/16/20   Scot Jun, FNP  hydrOXYzine (ATARAX/VISTARIL) 25 MG tablet Take 1 tablet (25 mg total) by mouth every 6 (six) hours as needed for itching. 10/26/20   Teodora Medici, FNP  Multiple Vitamin (THERA) TABS Take 1 tablet by mouth daily.    [provider]  predniSONE (DELTASONE) 20 MG tablet Take 3 PO QAM x3days, 2 PO QAM x3days, 1 PO QAM x3days 03/16/20   Scot Jun, FNP    Allergies    Patient has no known allergies.  Review of Systems   Review of Systems  Gastrointestinal:  Positive for abdominal pain.  Ten systems reviewed and are negative for acute change, except as noted in the HPI.    Physical Exam Updated Vital Signs BP 122/90 (BP Location: Right Arm)   Pulse 85   Temp 98.7 F (37.1 C) (Oral)   Resp 18   Ht 6\' 1"  (1.854 m)   Wt 93.4 kg   SpO2 98%   BMI 27.18 kg/m   Physical Exam Vitals and nursing note reviewed.  Constitutional:      General: He is not in acute distress.    Appearance:  He is well-developed. He is ill-appearing. He is not diaphoretic.     Comments: Appears ill/unwell, but nontoxic.  HENT:     Head: Normocephalic and atraumatic.     Right Ear: External ear normal.     Left Ear: External ear normal.  Eyes:     General: No scleral icterus.    Conjunctiva/sclera: Conjunctivae normal.  Cardiovascular:     Rate and Rhythm: Regular rhythm. Tachycardia present.     Pulses: Normal pulses.  Pulmonary:     Effort: Pulmonary effort is normal. No respiratory distress.     Comments: Sporadic dry, nonproductive cough. Respirations even and unlabored. Abdominal:     General: There is no distension.     Palpations: Abdomen is soft.     Tenderness: There is no abdominal tenderness. There is no guarding.     Comments: Abdomen soft, nondistended, nontender.   Musculoskeletal:        General: Normal range of motion.     Cervical back: Normal range of motion.  Skin:    General: Skin is warm and dry.     Coloration: Skin is not jaundiced or pale.     Findings: No erythema or rash.  Neurological:     Mental Status: He is alert and oriented to person, place, and time.     Coordination: Coordination normal.  Psychiatric:        Behavior: Behavior normal.    ED Results / Procedures / Treatments   Labs (all labs ordered are listed, but only abnormal results are displayed) Labs Reviewed  COMPREHENSIVE METABOLIC PANEL - Abnormal; Notable for the following components:      Result Value   Sodium 134 (*)    Chloride 97 (*)    Glucose, Bld 104 (*)    ALT 54 (*)    All other components within normal limits  CBC - Abnormal; Notable for the following components:   Hemoglobin 17.9 (*)    All other components within normal limits  URINALYSIS, ROUTINE W REFLEX MICROSCOPIC - Abnormal; Notable for the following components:   Bilirubin Urine SMALL (*)    All other components within normal limits  LIPASE, BLOOD  MONONUCLEOSIS SCREEN  CK    EKG EKG Interpretation  Date/Time:  Thursday January 11 2021 15:28:17 EST Ventricular Rate:  85 PR Interval:  132 QRS Duration: 89 QT Interval:  350 QTC Calculation: 417 R Axis:   71 Text Interpretation: Sinus rhythm Probable left atrial enlargement Borderline ST depression, inferior leads Borderline ST elevation, anterolateral leads No previous ECGs available Confirmed by Fredia Sorrow [phone]) on 01/11/2021 3:46:10 PM  Radiology DG Chest 2 View  Result Date: 01/11/2021 CLINICAL DATA:  Shortness of breath, epigastric pain, nausea, vomiting and fever for 1 day, frequent urination for 2 days EXAM: CHEST - 2 VIEW COMPARISON:  None FINDINGS: Normal heart size, mediastinal contours, and pulmonary vascularity. Lungs clear. No pleural effusion or pneumothorax. Bones unremarkable. IMPRESSION: Normal exam.  Electronically Signed   By: Lavonia Dana M.D.   On: 01/11/2021 15:25   CT ABDOMEN PELVIS W CONTRAST  Result Date: 01/11/2021 CLINICAL DATA:  Acute abdominal pain and fevers for 1 day, initial encounter EXAM: CT ABDOMEN AND PELVIS WITH CONTRAST TECHNIQUE: Multidetector CT imaging of the abdomen and pelvis was performed using the standard protocol following bolus administration of intravenous contrast. CONTRAST:  170mL OMNIPAQUE IOHEXOL 300 MG/ML  SOLN COMPARISON:  None. FINDINGS: Lower chest: No acute abnormality. Hepatobiliary: Mild fatty infiltration of the liver is  noted. Gallbladder is within normal limits. Pancreas: Unremarkable. No pancreatic ductal dilatation or surrounding inflammatory changes. Spleen: Normal in size without focal abnormality. Adrenals/Urinary Tract: Adrenal glands are within normal limits. Kidneys are well visualized bilaterally. No renal calculi or obstructive changes are seen. The bladder is partially distended. Stomach/Bowel: The appendix is within normal limits. No obstructive or inflammatory changes of the colon are seen. Small bowel and stomach are within normal limits. Vascular/Lymphatic: No significant vascular findings are present. No enlarged abdominal or pelvic lymph nodes. Reproductive: Prostate is unremarkable. Other: No abdominal wall hernia or abnormality. No abdominopelvic ascites. Musculoskeletal: No acute or significant osseous findings. IMPRESSION: Fatty liver. No acute abnormality noted. Electronically Signed   By: Alcide Clever M.D.   On: 01/11/2021 17:02    Procedures Procedures   Medications Ordered in ED Medications  sodium chloride 0.9 % bolus 1,000 mL (0 mLs Intravenous Stopped 01/11/21 1633)  sodium chloride 0.9 % bolus 500 mL (0 mLs Intravenous Stopped 01/11/21 1749)  iohexol (OMNIPAQUE) 300 MG/ML solution 100 mL (100 mLs Intravenous Contrast Given 01/11/21 1645)    ED Course  I have reviewed the triage vital signs and the nursing notes.  Pertinent  labs & imaging results that were available during my care of the patient were reviewed by me and considered in my medical decision making (see chart for details).    MDM Rules/Calculators/A&P                           35 year old male presents to the emergency department for further evaluation of generalized fatigue and malaise.  Has had an associated episode of abdominal pain with nausea and vomiting which has now resolved.  Also reports polyuria, nocturia, cough, lightheadedness.  Suspect degree of dehydration despite reported adequate oral intake.  Patient initially tachycardic on arrival which has improved with IV fluids.  He is afebrile in the ED without tachypnea, leukocytosis.  Does not meet criteria for SIRS/sepsis at this time.  Diabetes considered given vague nature of symptoms with nausea, vomiting, polyuria; however, glucose is reassuring.  Considered UTI and/or prostatitis, but absence of fever, leukocytosis, pyuria, bacteriuria makes this unlikely; also does not clinically fit with episode of upper abdominal pain and cough. No significant electrolyte derangements.  Liver and kidney function preserved.  He had an outpatient test for flu and COVID which were negative.  Also negative for mononucleosis in the ED.  His CK is normal; no concern for rhabdomyolysis.  Patient further underwent chest x-ray as well as abdominal CT, both of which are negative for acute or emergent process.  Specifically no PNA, PTX, pleural effusion, cardiomegaly, vascular congestion on CXR.  EKG done in light of lightheadedness, near syncope.  This is without concerning arrhythmia or signs of acute ischemia.  Suspect that patient may be suffering from an unspecified viral illness.  Plan to continue outpatient supportive care.  Counseled on need for follow-up with his primary care doctor.  He has been instructed to return to the ED for new or concerning symptoms such as persistent abdominal pain, uncontrolled  vomiting, increased WOB.  Patient discharged in stable condition with no unaddressed concerns.  Vitals:   01/11/21 1530 01/11/21 1600 01/11/21 1630 01/11/21 1750  BP: 126/89 132/85 119/81 122/90  Pulse: (!) 103 90 88 85  Resp: 13 16 20 18   Temp:      TempSrc:      SpO2: 95% 98% 98% 98%  Weight:  Height:        Final Clinical Impression(s) / ED Diagnoses Final diagnoses:  Viral illness    Rx / DC Orders ED Discharge Orders     None        Antonietta Breach, PA-C 01/11/21 1909    Fredia Sorrow, MD 01/13/21 [phone]

## 2021-01-11 NOTE — ED Triage Notes (Addendum)
C/o epigastric pain n/v , fever  x 1 , freq urination x 2 days, sent here from UC for eval , covid/ flu test neg

## 2021-01-15 DIAGNOSIS — M545 Low back pain, unspecified: Secondary | ICD-10-CM | POA: Diagnosis not present

## 2021-03-05 DIAGNOSIS — K219 Gastro-esophageal reflux disease without esophagitis: Secondary | ICD-10-CM | POA: Diagnosis not present

## 2021-03-05 DIAGNOSIS — R1313 Dysphagia, pharyngeal phase: Secondary | ICD-10-CM | POA: Diagnosis not present

## 2021-03-05 DIAGNOSIS — R1013 Epigastric pain: Secondary | ICD-10-CM | POA: Diagnosis not present

## 2021-03-05 DIAGNOSIS — R1033 Periumbilical pain: Secondary | ICD-10-CM | POA: Diagnosis not present

## 2021-03-07 DIAGNOSIS — K200 Eosinophilic esophagitis: Secondary | ICD-10-CM | POA: Diagnosis not present

## 2021-03-07 DIAGNOSIS — K2970 Gastritis, unspecified, without bleeding: Secondary | ICD-10-CM | POA: Diagnosis not present

## 2021-03-07 DIAGNOSIS — K222 Esophageal obstruction: Secondary | ICD-10-CM | POA: Diagnosis not present

## 2021-03-07 DIAGNOSIS — K449 Diaphragmatic hernia without obstruction or gangrene: Secondary | ICD-10-CM | POA: Diagnosis not present

## 2021-03-07 DIAGNOSIS — K317 Polyp of stomach and duodenum: Secondary | ICD-10-CM | POA: Diagnosis not present

## 2021-03-07 DIAGNOSIS — K2289 Other specified disease of esophagus: Secondary | ICD-10-CM | POA: Diagnosis not present

## 2021-03-07 DIAGNOSIS — K219 Gastro-esophageal reflux disease without esophagitis: Secondary | ICD-10-CM | POA: Diagnosis not present

## 2021-03-07 DIAGNOSIS — K2930 Chronic superficial gastritis without bleeding: Secondary | ICD-10-CM | POA: Diagnosis not present

## 2021-03-07 DIAGNOSIS — R1310 Dysphagia, unspecified: Secondary | ICD-10-CM | POA: Diagnosis not present

## 2021-03-09 DIAGNOSIS — H60502 Unspecified acute noninfective otitis externa, left ear: Secondary | ICD-10-CM | POA: Diagnosis not present

## 2021-10-11 DIAGNOSIS — T63461A Toxic effect of venom of wasps, accidental (unintentional), initial encounter: Secondary | ICD-10-CM | POA: Diagnosis not present

## 2022-01-14 DIAGNOSIS — J45909 Unspecified asthma, uncomplicated: Secondary | ICD-10-CM | POA: Diagnosis not present

## 2022-05-25 IMAGING — CR DG CHEST 2V
2 series · 2 of 2 positions shown · non-contrast
Comparison: None

CLINICAL DATA: Shortness of breath, epigastric pain, nausea,
vomiting and fever for 1 day, frequent urination for 2 days

EXAM:
CHEST - 2 VIEW

[w chest pa]
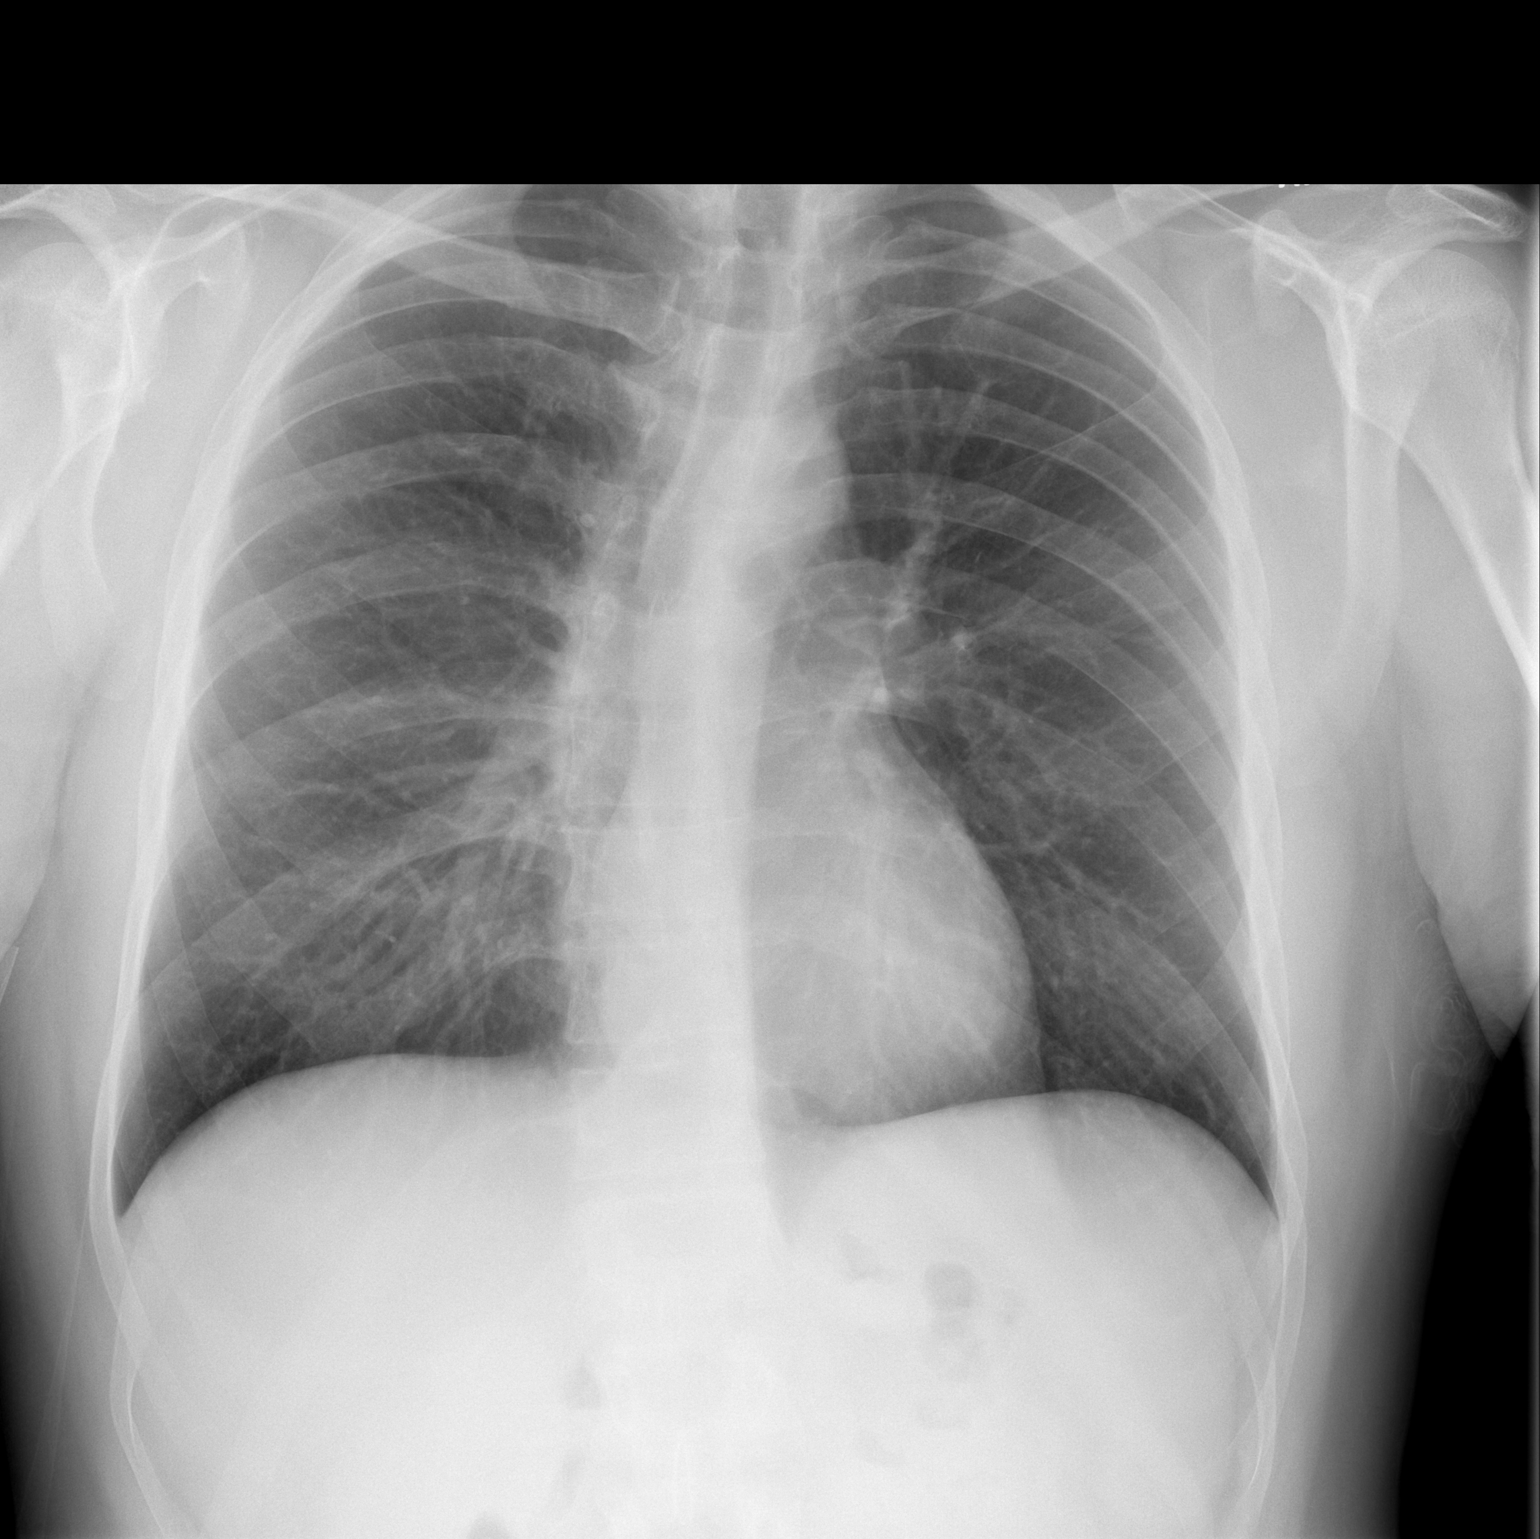

[w chest lat]
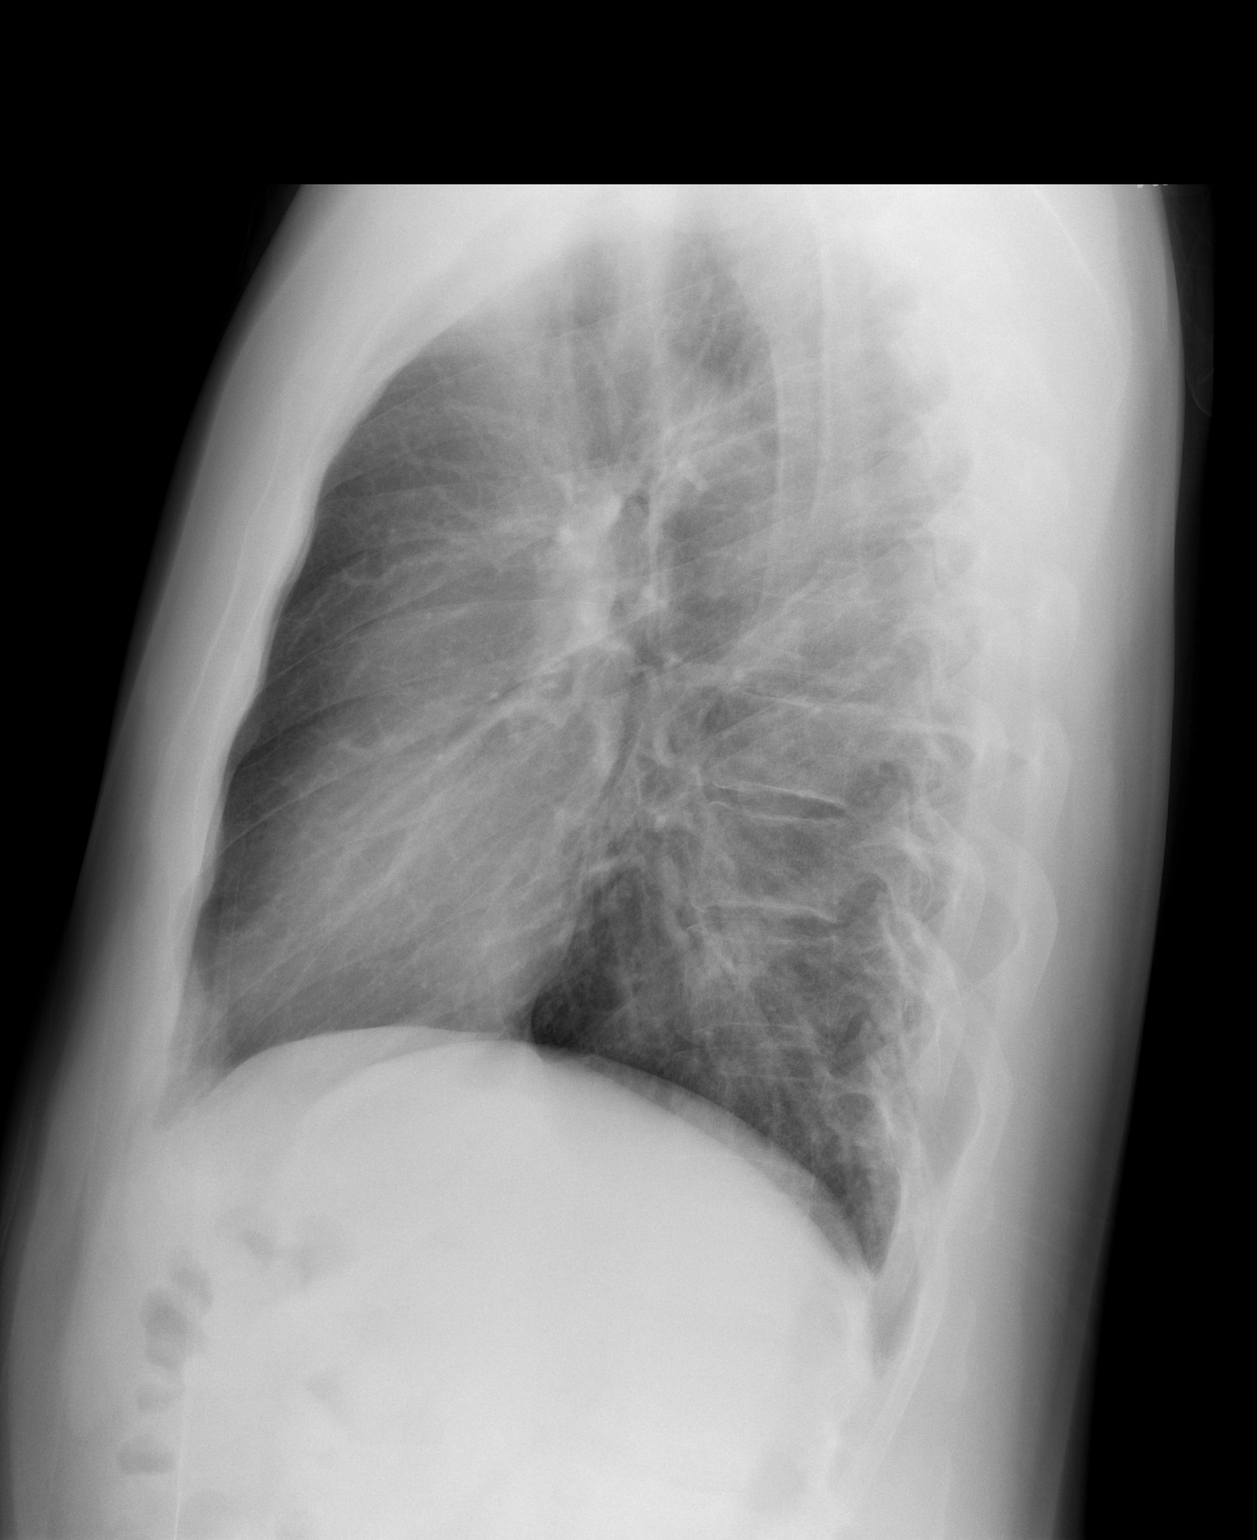

[2 of 2 positions shown; findings below may reference images not displayed]

FINDINGS: Normal heart size, mediastinal contours, and pulmonary vascularity.

Lungs clear.

No pleural effusion or pneumothorax.

Bones unremarkable.
IMPRESSION: Normal exam.

## 2022-05-25 IMAGING — CT CT ABD-PELV W/ CM
2 of 4 series · 16 of 46 positions shown, 18 images · IV contrast (Omnipaque)
Comparison: None.

CLINICAL DATA: Acute abdominal pain and fevers for 1 day, initial
encounter

EXAM:
CT ABDOMEN AND PELVIS WITH CONTRAST
TECHNIQUE: Multidetector CT imaging of the abdomen and pelvis was performed
using the standard protocol following bolus administration of
intravenous contrast.
CONTRAST:  100mL OMNIPAQUE IOHEXOL 300 MG/ML  SOLN

[Series 2: axial st · axial · 0.95mm/px · z∈[+601,+1061]mm · 13 of 100 slices shown, 15 images]
[im 4/100  soft-tissue]
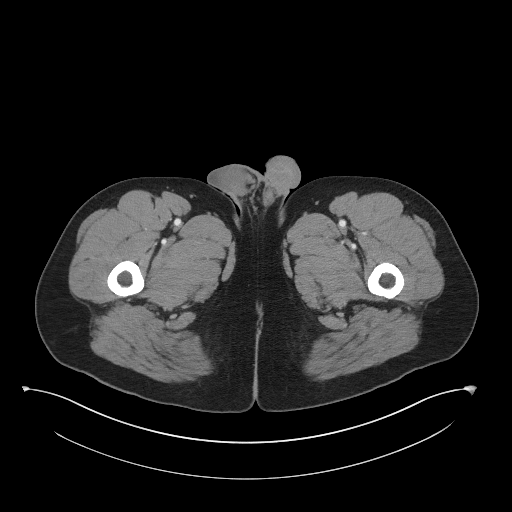
[im 4/100  bone]
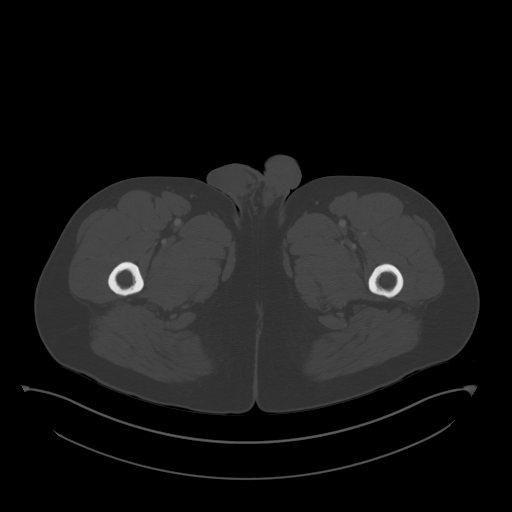
[im 12/100  soft-tissue]
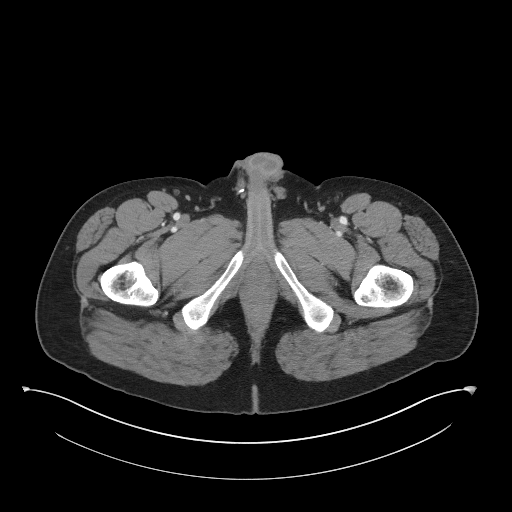
[im 20/100  soft-tissue]
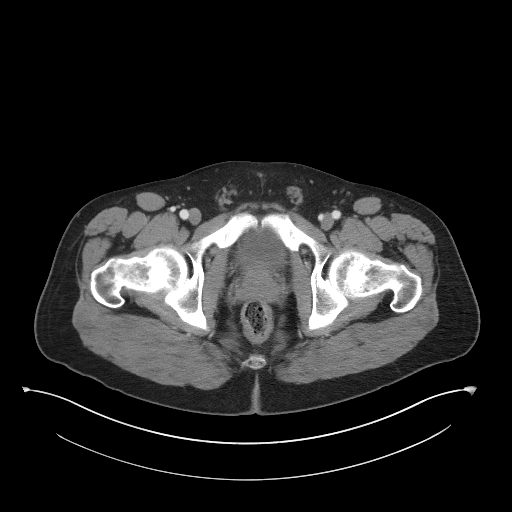
[im 28/100  soft-tissue]
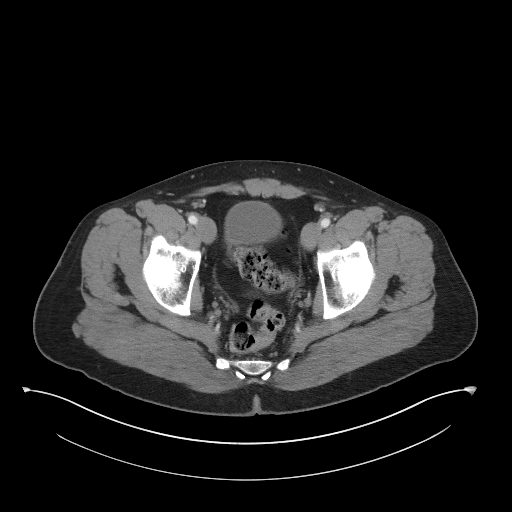
[im 36/100  soft-tissue]
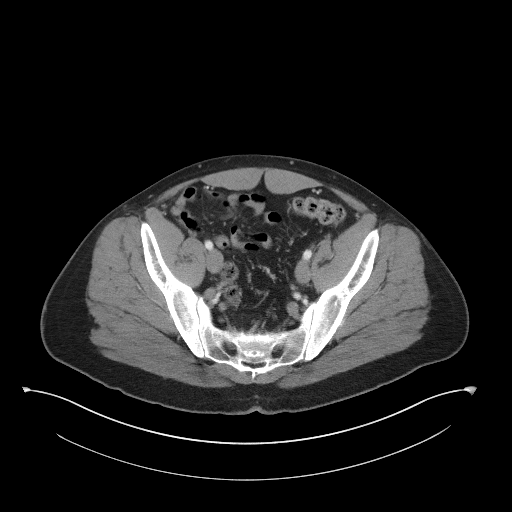
[im 44/100  soft-tissue]
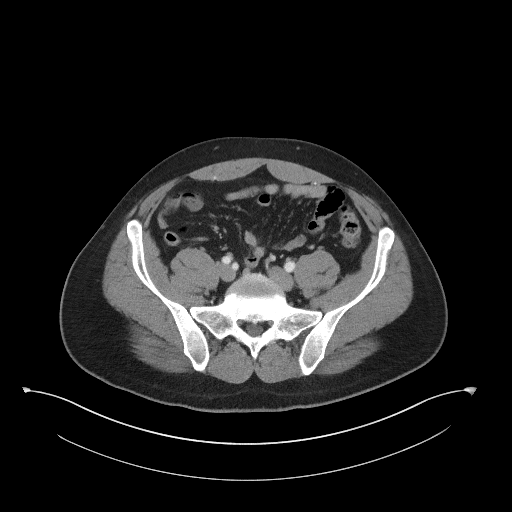
[im 52/100  soft-tissue]
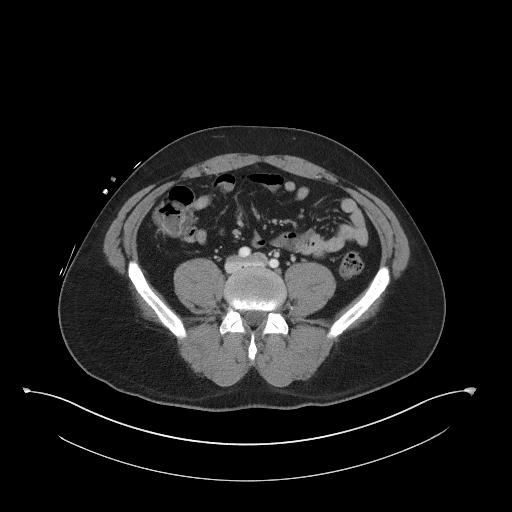
[im 56/100  soft-tissue]
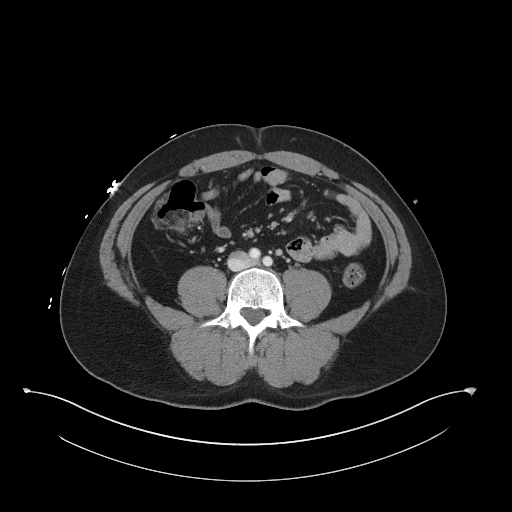
[im 64/100  soft-tissue]
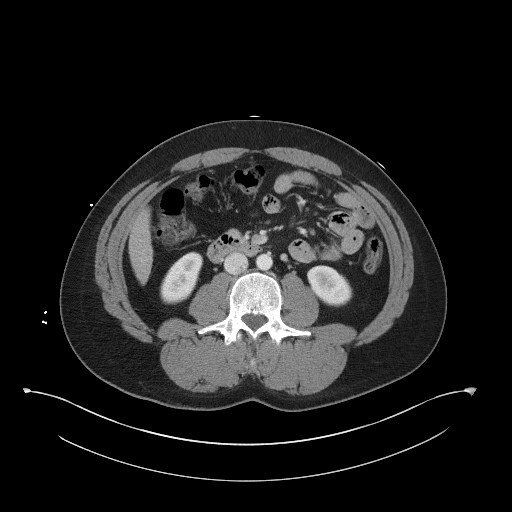
[im 64/100  bone]
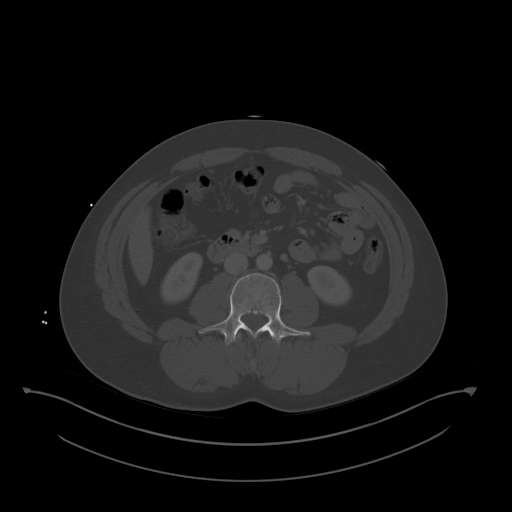
[im 72/100  soft-tissue]
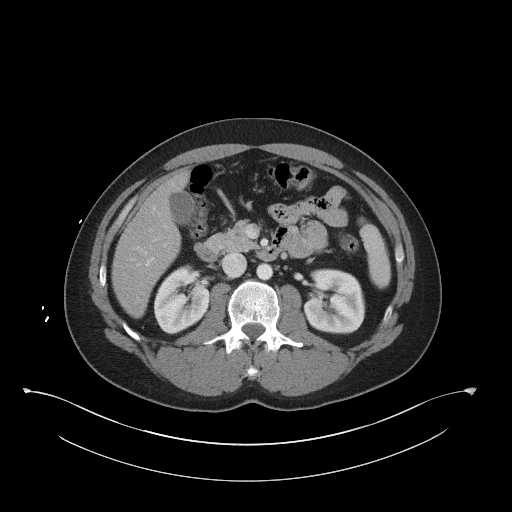
[im 80/100  soft-tissue]
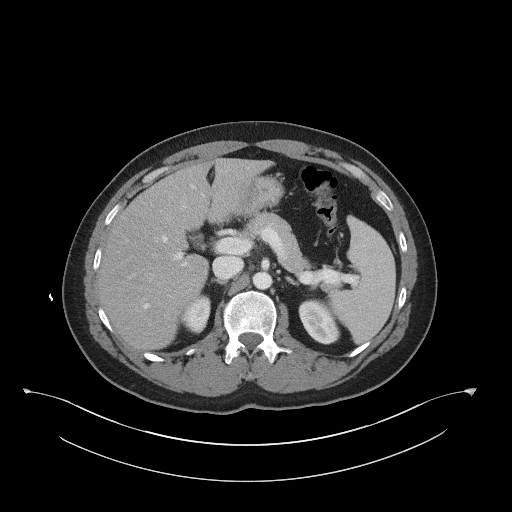
[im 88/100  soft-tissue]
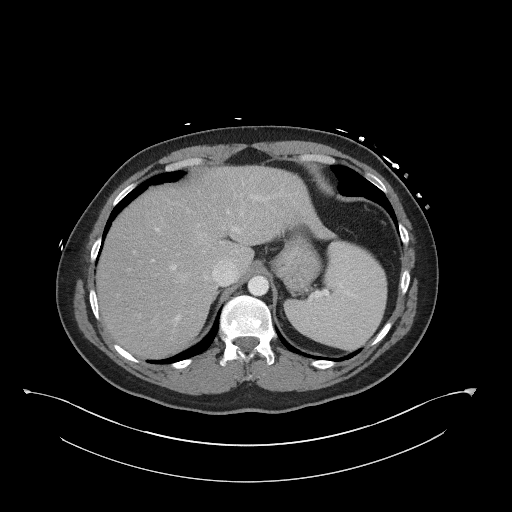
[im 96/100  soft-tissue]
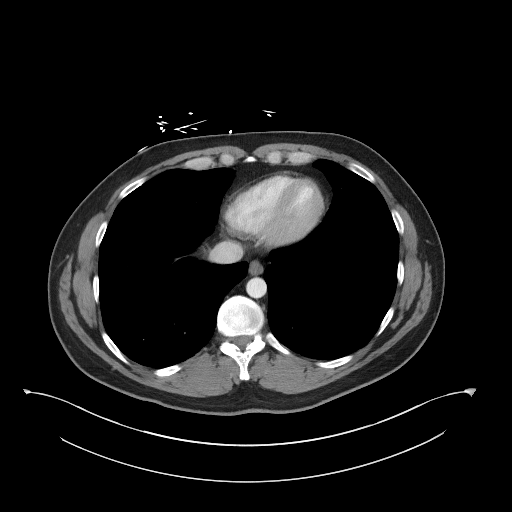

[Series 5: coronal st · coronal · 0.72mm/px · 3 of 101 slices shown]
[im 34/101  soft-tissue]
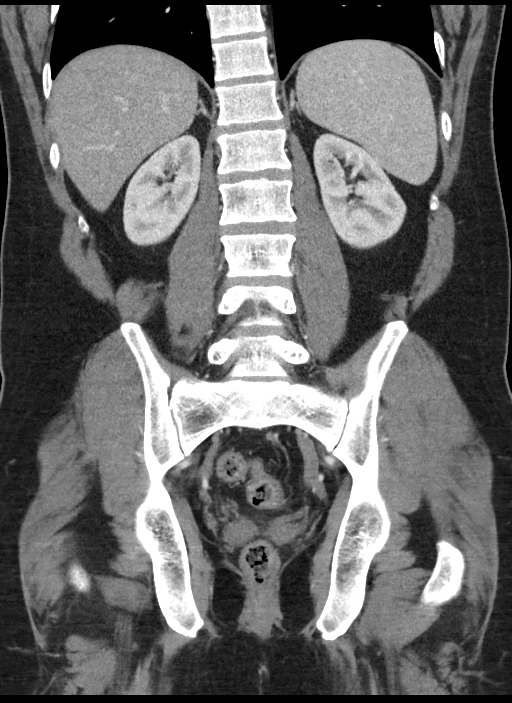
[im 45/101  soft-tissue]
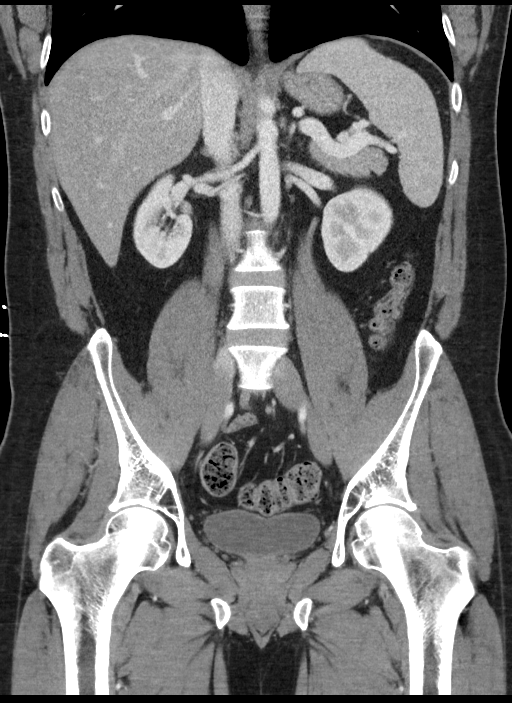
[im 56/101  soft-tissue]
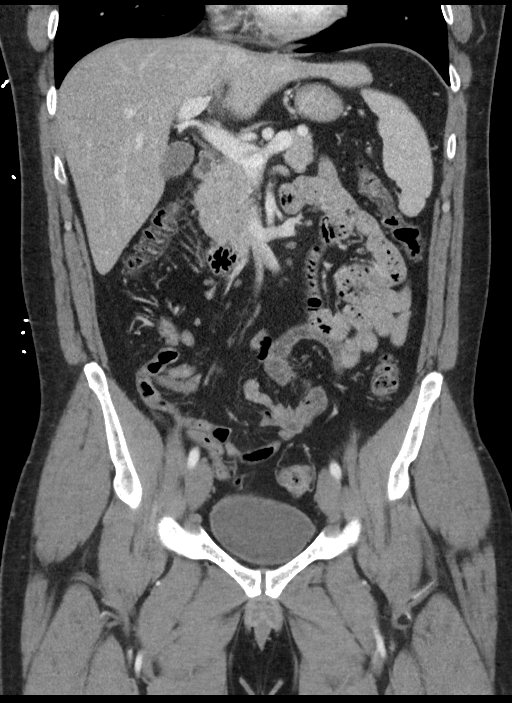

[16 of 46 positions shown; findings below may reference images not displayed]

FINDINGS: Lower chest: No acute abnormality.

Hepatobiliary: Mild fatty infiltration of the liver is noted.
Gallbladder is within normal limits.

Pancreas: Unremarkable. No pancreatic ductal dilatation or
surrounding inflammatory changes.

Spleen: Normal in size without focal abnormality.

Adrenals/Urinary Tract: Adrenal glands are within normal limits.
Kidneys are well visualized bilaterally. No renal calculi or
obstructive changes are seen. The bladder is partially distended.

Stomach/Bowel: The appendix is within normal limits. No obstructive
or inflammatory changes of the colon are seen. Small bowel and
stomach are within normal limits.

Vascular/Lymphatic: No significant vascular findings are present. No
enlarged abdominal or pelvic lymph nodes.

Reproductive: Prostate is unremarkable.

Other: No abdominal wall hernia or abnormality. No abdominopelvic
ascites.

Musculoskeletal: No acute or significant osseous findings.
IMPRESSION: Fatty liver.

No acute abnormality noted.

## 2023-02-26 ENCOUNTER — Emergency Department (HOSPITAL_BASED_OUTPATIENT_CLINIC_OR_DEPARTMENT_OTHER)
Admission: EM | Admit: 2023-02-26 | Discharge: 2023-02-26 | Disposition: A | Discharge status: Home / Self Care | Payer: Managed Care, Other (non HMO) | Attending: Emergency Medicine | Admitting: Emergency Medicine

## 2023-02-26 ENCOUNTER — Encounter (HOSPITAL_BASED_OUTPATIENT_CLINIC_OR_DEPARTMENT_OTHER): Payer: Self-pay

## 2023-02-26 ENCOUNTER — Other Ambulatory Visit: Payer: Self-pay

## 2023-02-26 ENCOUNTER — Emergency Department (HOSPITAL_BASED_OUTPATIENT_CLINIC_OR_DEPARTMENT_OTHER): Payer: Managed Care, Other (non HMO)

## 2023-02-26 DIAGNOSIS — Z7951 Long term (current) use of inhaled steroids: Secondary | ICD-10-CM | POA: Diagnosis not present

## 2023-02-26 DIAGNOSIS — Z87891 Personal history of nicotine dependence: Secondary | ICD-10-CM | POA: Diagnosis not present

## 2023-02-26 DIAGNOSIS — R0602 Shortness of breath: Secondary | ICD-10-CM | POA: Diagnosis present

## 2023-02-26 DIAGNOSIS — Z7952 Long term (current) use of systemic steroids: Secondary | ICD-10-CM | POA: Diagnosis not present

## 2023-02-26 DIAGNOSIS — J4521 Mild intermittent asthma with (acute) exacerbation: Secondary | ICD-10-CM | POA: Diagnosis not present

## 2023-02-26 DIAGNOSIS — Z20822 Contact with and (suspected) exposure to covid-19: Secondary | ICD-10-CM | POA: Diagnosis not present

## 2023-02-26 LAB — RESP PANEL BY RT-PCR (RSV, FLU A&B, COVID)  RVPGX2
Influenza A by PCR: NEGATIVE
Influenza B by PCR: NEGATIVE
Resp Syncytial Virus by PCR: NEGATIVE
SARS Coronavirus 2 by RT PCR: NEGATIVE

## 2023-02-26 MED ORDER — ALBUTEROL SULFATE HFA 108 (90 BASE) MCG/ACT IN AERS: 1.0000 | INHALATION_SPRAY | Freq: Four times a day (QID) | RESPIRATORY_TRACT | 0 refills | Status: AC | PRN

## 2023-02-26 MED ORDER — PREDNISONE 50 MG PO TABS: 50.0000 mg | ORAL_TABLET | Freq: Every day | ORAL | 0 refills | Status: AC

## 2023-02-26 MED ORDER — ALBUTEROL SULFATE HFA 108 (90 BASE) MCG/ACT IN AERS
2.0000 | INHALATION_SPRAY | RESPIRATORY_TRACT | Status: DC | PRN
  Administered 2023-02-26: 2 via RESPIRATORY_TRACT
  Filled 2023-02-26: qty 6.7

## 2023-02-26 NOTE — ED Provider Notes (Signed)
 [place] [facility] AT [facility] Provider Note  CSN: [id number] Arrival date & time: 02/26/23 1044  Chief Complaint(s) Shortness of Breath  HPI Caleb Thompson is a 38 y.o. male history of asthma presenting to the emergency department with shortness of breath.  Patient reports he has had increased shortness of breath over the past 2 days, has associated dry cough, runny nose.  He reports that the shortness of breath is worse at night.  Reports associated wheezing.  His children have asthma and a nebulizer so he has been using their inhalers and nebulizing machine which does help.  No chest pain, abdominal pain, back pain.  No fevers or chills.   Past Medical History Past Medical History:  Diagnosis Date   Asthma    There are no active problems to display for this patient.  Home Medication(s) Prior to Admission medications   Medication Sig Start Date End Date Taking? Authorizing Provider  albuterol  (VENTOLIN  HFA) 108 (90 Base) MCG/ACT inhaler Inhale 1-2 puffs into the lungs every 6 (six) hours as needed for wheezing or shortness of breath. 02/26/23  Yes Mordecai Applebaum, MD  predniSONE  (DELTASONE ) 50 MG tablet Take 1 tablet (50 mg total) by mouth daily for 5 days. 02/26/23 03/03/23 Yes Mordecai Applebaum, MD  cyclobenzaprine  (FLEXERIL ) 10 MG tablet Take 1 tablet (10 mg total) by mouth 2 (two) times daily as needed for muscle spasms. 03/16/20   Buena Carmine, NP  hydrOXYzine  (ATARAX /VISTARIL ) 25 MG tablet Take 1 tablet (25 mg total) by mouth every 6 (six) hours as needed for itching. 10/26/20   Dodson Freestone, FNP  Multiple Vitamin (THERA) TABS Take 1 tablet by mouth daily.    [provider]                                                                                                                                    Past Surgical History Past Surgical History:  Procedure Laterality Date   FINGER AMPUTATION     Family History Family History   Problem Relation Age of Onset   Healthy Mother    Healthy Father     Social History Social History   Tobacco Use   Smoking status: Former    Current packs/day: 0.50    Types: Cigarettes   Smokeless tobacco: Never  Substance Use Topics   Alcohol use: Yes    Comment: social   Drug use: No   Allergies Patient has no known allergies.  Review of Systems Review of Systems  All other systems reviewed and are negative.   Physical Exam Vital Signs  I have reviewed the triage vital signs BP (!) 128/97 (BP Location: Left Arm)   Pulse 88   Temp 98.1 F (36.7 C) (Oral)   Resp 20   Wt 94.3 kg   SpO2 100%   BMI 27.44 kg/m  Physical Exam Vitals and nursing note  reviewed.  Constitutional:      General: He is not in acute distress.    Appearance: Normal appearance.  HENT:     Mouth/Throat:     Mouth: Mucous membranes are moist.  Eyes:     Conjunctiva/sclera: Conjunctivae normal.  Cardiovascular:     Rate and Rhythm: Normal rate and regular rhythm.  Pulmonary:     Effort: Pulmonary effort is normal. No respiratory distress.     Breath sounds: Wheezing (trace) present.  Abdominal:     General: Abdomen is flat.     Palpations: Abdomen is soft.     Tenderness: There is no abdominal tenderness.  Musculoskeletal:     Right lower leg: No edema.     Left lower leg: No edema.  Skin:    General: Skin is warm and dry.     Capillary Refill: Capillary refill takes less than 2 seconds.  Neurological:     Mental Status: He is alert and oriented to person, place, and time. Mental status is at baseline.  Psychiatric:        Mood and Affect: Mood normal.        Behavior: Behavior normal.     ED Results and Treatments Labs (all labs ordered are listed, but only abnormal results are displayed) Labs Reviewed  RESP PANEL BY RT-PCR (RSV, FLU A&B, COVID)  RVPGX2                                                                                                                           Radiology DG Chest 2 View Result Date: 02/26/2023 CLINICAL DATA:  Cough. EXAM: CHEST - 2 VIEW COMPARISON:  Chest radiograph dated January 11, 2021. FINDINGS: The heart size and mediastinal contours are within normal limits. Both lungs are clear. No pleural effusion or pneumothorax. No acute osseous abnormality. IMPRESSION: No acute cardiopulmonary findings. Electronically Signed   By: Mannie Seek M.D.   On: 02/26/2023 13:27    Pertinent labs & imaging results that were available during my care of the patient were reviewed by me and considered in my medical decision making (see MDM for details).  Medications Ordered in ED Medications  albuterol  (VENTOLIN  HFA) 108 (90 Base) MCG/ACT inhaler 2 puff (2 puffs Inhalation Given 02/26/23 1135)  Procedures Procedures  (including critical care time)  Medical Decision Making / ED Course   MDM:  38 year old male with history of asthma presenting with shortness of breath.  Patient well-appearing, physical examination with very trace wheezing although has already received albuterol  inhaler while in the emergency department.  He reports this helped.  No leg swelling or other findings.  Suspect mild asthma exacerbation possibly in the setting of URI.  His flu/COVID/RSV testing is negative.  His chest x-ray is clear.  Given his persistent symptoms, will prescribe prednisone .  Advised follow-up with his primary doctor given that he has been using his children's nebulizer machine, may benefit from further asthma management. Will discharge patient to home. All questions answered. Patient comfortable with plan of discharge. Return precautions discussed with patient and specified on the after visit summary.      Lab Tests: -I ordered, reviewed, and interpreted labs.   The pertinent results include:   Labs Reviewed   RESP PANEL BY RT-PCR (RSV, FLU A&B, COVID)  RVPGX2    Notable for negative results   EKG   EKG Interpretation Date/Time:    Ventricular Rate:    PR Interval:    QRS Duration:    QT Interval:    QTC Calculation:   R Axis:      Text Interpretation:           Imaging Studies ordered: I ordered imaging studies including CXR On my interpretation imaging demonstrates no acute process I independently visualized and interpreted imaging. I agree with the radiologist interpretation   Medicines ordered and prescription drug management: Meds ordered this encounter  Medications   albuterol  (VENTOLIN  HFA) 108 (90 Base) MCG/ACT inhaler 2 puff   albuterol  (VENTOLIN  HFA) 108 (90 Base) MCG/ACT inhaler    Sig: Inhale 1-2 puffs into the lungs every 6 (six) hours as needed for wheezing or shortness of breath.    Dispense:  8.5 g    Refill:  0   predniSONE  (DELTASONE ) 50 MG tablet    Sig: Take 1 tablet (50 mg total) by mouth daily for 5 days.    Dispense:  5 tablet    Refill:  0    -I have reviewed the patients home medicines and have made adjustments as needed  Social Determinants of Health:  Diagnosis or treatment significantly limited by social determinants of health: former smoker   Reevaluation: After the interventions noted above, I reevaluated the patient and found that their symptoms have improved  Co morbidities that complicate the patient evaluation  Past Medical History:  Diagnosis Date   Asthma       Dispostion: Disposition decision including need for hospitalization was considered, and patient discharged from emergency department.    Final Clinical Impression(s) / ED Diagnoses Final diagnoses:  Mild intermittent asthma with exacerbation     This chart was dictated using voice recognition software.  Despite best efforts to proofread,  errors can occur which can change the documentation meaning.    Mordecai Applebaum, MD 02/26/23 [phone]

## 2023-02-26 NOTE — ED Notes (Signed)
   02/26/23 1052  Respiratory Assessment  $ RT Protocol Assessment  Yes  Assessment Type Pre-treatment  Respiratory Pattern Regular;Unlabored;Symmetrical  Chest Assessment Chest expansion symmetrical  Cough Non-productive  Bilateral Breath Sounds Diminished  Oxygen Therapy/Pulse Ox  O2 Therapy Room air  SpO2 97 %   Seen before triage.  Increased difficulty breathing, dry cough, using family members nebulizer or MDI about every 4 hours.  Waking up and needing treatments.  Last treatment about 2 hours ago. No sick contacts.

## 2023-02-26 NOTE — ED Triage Notes (Addendum)
 Pt reports cough, runny nose and SOB for 2 days. Hx of asthma as child, but never had rescue inhaler. Pt has been using his childs nebulizer. Wheezing at night

## 2023-02-26 NOTE — Discharge Instructions (Signed)
 We evaluated you for your shortness of breath and cough.  Your chest x-ray did not show any pneumonia.  Your flu and COVID testing was negative.  We have prescribed you an albuterol  inhaler and steroids.  Please take the steroids as prescribed.  Please follow-up closely with your primary doctor.  Return if symptoms worsen.
